# Patient Record
Sex: Male | Born: 1995 | Race: White | Hispanic: No | Marital: Single | Smoking: Former smoker
Health system: Southern US, Community
[De-identification: ages and names within clinical notes are randomized; demographics above are authoritative.]

## PROBLEM LIST (undated history)

## (undated) DIAGNOSIS — Z8489 Family history of other specified conditions: Secondary | ICD-10-CM

## (undated) DIAGNOSIS — F410 Panic disorder [episodic paroxysmal anxiety] without agoraphobia: Secondary | ICD-10-CM

## (undated) DIAGNOSIS — M25362 Other instability, left knee: Secondary | ICD-10-CM

## (undated) HISTORY — PX: DENTAL SURGERY: SHX609

## (undated) HISTORY — PX: WISDOM TOOTH EXTRACTION: SHX21

## (undated) HISTORY — PX: KNEE ARTHROSCOPY: SHX127

---
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
## 2017-11-17 ENCOUNTER — Other Ambulatory Visit: Payer: Self-pay

## 2017-11-17 ENCOUNTER — Encounter: Payer: Self-pay | Admitting: Emergency Medicine

## 2017-11-17 ENCOUNTER — Ambulatory Visit
Admission: EM | Admit: 2017-11-17 | Discharge: 2017-11-17 | Disposition: A | Discharge status: Home / Self Care | Payer: Medicaid Other | Attending: Emergency Medicine | Admitting: Emergency Medicine

## 2017-11-17 DIAGNOSIS — F410 Panic disorder [episodic paroxysmal anxiety] without agoraphobia: Secondary | ICD-10-CM

## 2017-11-17 DIAGNOSIS — F411 Generalized anxiety disorder: Secondary | ICD-10-CM

## 2017-11-17 HISTORY — DX: Panic disorder (episodic paroxysmal anxiety): F41.0

## 2017-11-17 MED ORDER — DIAZEPAM 2 MG PO TABS: 2.0000 mg | ORAL_TABLET | Freq: Three times a day (TID) | ORAL | 0 refills | Status: DC | PRN

## 2017-11-17 MED ORDER — HYDROXYZINE HCL 50 MG PO TABS: 50.0000 mg | ORAL_TABLET | Freq: Four times a day (QID) | ORAL | 1 refills | Status: DC | PRN

## 2017-11-17 MED ORDER — PAROXETINE HCL 10 MG PO TABS: 10.0000 mg | ORAL_TABLET | Freq: Every day | ORAL | 0 refills | Status: DC

## 2017-11-17 NOTE — ED Provider Notes (Addendum)
HPI  SUBJECTIVE:  Zachary Ballard is a 22 y.o. male who presents with 1-2 panic attacks per day described as shortness of breath for the past 2 weeks.  These last up to several hours.  He states that his anxiety has "been through the roof" over the past 2 months.  States that the panic attacks are becoming more frequent, although they do not happen daily.  He reports that his mind is racing, that he has difficulty getting a sleep at night.  States that he has vivid nightmares and is only getting 3 hours of sleep at night due to the nightmares.  He states that he has a long history of panic attacks/anxiety and states that it was fairly under control for years 2 months ago.  States that his current panic attacks are identical to previous ones.  He is not sure why it came back.  He states that he was on a as needed medication for his panic attacks, does not remember what it was.  States that it acted quickly.  He was not on any long-term or preventative medications.  He has tried melatonin, Benadryl, Dramamine without improvement in his symptoms.  No aggravating factors.  No suicidal, homicidal ideation, auditory or visual hallucinations, denies depression.  Past medical history of panic attacks, no history of depression, psychiatric admissions, suicide attempts.  PMD: None.    Past Medical History:  Diagnosis Date  . Family history of adverse reaction to anesthesia    mom itches/ nausea/hard to get to sleep  . Panic attacks   . Patellar instability of left knee    s/p multiple surgeries by Dr. Ivin Poot at [facility]    Past Surgical History:  Procedure Laterality Date  . DENTAL SURGERY    . KNEE ARTHROSCOPY Left    medial patellofemoral ligament repair  . KNEE SURGERY  2018   Dr. Ivin Poot at [facility], hx of multiple surgeries  . WISDOM TOOTH EXTRACTION      Family History  Problem Relation Age of Onset  . Epilepsy Mother   . Other Father        unknown medical history    Social History   Tobacco Use   . Smoking status: Former Smoker    Last attempt to quit: 11/17/2016    Years since quitting: 1.0  . Smokeless tobacco: Former Neurosurgeon    Types: Chew  Substance Use Topics  . Alcohol use: Not Currently  . Drug use: Not Currently    Types: Marijuana    Comment: used in high school once    No current facility-administered medications for this encounter.   Current Outpatient Medications:  .  hydrOXYzine (ATARAX/VISTARIL) 50 MG tablet, Take 1 tablet (50 mg total) by mouth every 6 (six) hours as needed for anxiety. May take 2 tabs at night, Disp: 60 tablet, Rfl: 1  No Known Allergies   ROS  As noted in HPI.   Physical Exam  BP 115/74 (BP Location: Left Arm)   Pulse 64   Temp 98.3 F (36.8 C) (Oral)   Resp 16   Ht 5\' 9"  (1.753 m)   Wt 68 kg   SpO2 100%   BMI 22.15 kg/m   Constitutional: Well developed, well nourished, no acute distress.  Well-groomed Eyes:  EOMI, conjunctiva normal bilaterally HENT: Normocephalic, atraumatic,mucus membranes moist Respiratory: Normal inspiratory effort lungs clear bilaterally Cardiovascular: Normal rate, regular rhythm, no murmurs, rubs, gallops Endocrine: Normal nontender thyroid. GI: nondistended skin: No rash, skin intact Musculoskeletal:  no deformities Neurologic: Alert & oriented x 3, no focal neuro deficits Psychiatric: Speech and behavior appropriate.  Normal affect, no apparent auditory or visual hallucinations.  No suicidal or homicidal ideation.    ED Course   Medications - No data to display  No orders of the defined types were placed in this encounter.   No results found for this or any previous visit (from the past 24 hour(s)). No results found.  ED Clinical Impression  Panic attacks  Anxiety state   ED Assessment/Plan  Care everywhere where records reviewed.  No note of anxiety.   Patient has a long-standing history of anxiety, which was well controlled up until several months ago.  He does not recall being  on any long-term medications for this.  He does not appear to be having a psychiatric emergency at this time.  We will try Vistaril 50 mg every 6 hours, 100 mg at night.  Provided information about the [facility] mental health resources for counseling in [place] and also gave patient Dr. Raelyn Mora, psychiatrist at [facility] information.  We will also provide primary care referral for ongoing care.  Initially wrote a prescription of hydroxyzine, patient states that he talked to his mother, and states that the hydroxyzine "did not work well for him".  States that his mother told him that he also tried Zoloft without improvement in his symptoms.  States that he was on low-dose diazepam in the past that worked well for him.  Patient does not want the hydroxyzine as he is afraid that it makes his panic attacks worse.  We will try some low-dose Paxil, 10 mg, and short course of diazepam to take as needed.  Discussed with him the possibility of addiction with the diazepam and instructed him to take it only as needed.  [facility] narcotic database reviewed.  Patient had a prescription of 20 oxycodone written in February.  No history of benzodiazepine prescriptions.  he denies substance, alcohol abuse history.  Discussed  MDM, treatment plan, and plan for follow-up with patient. Discussed sn/sx that should prompt return to the ED. patient agrees with plan.   Meds ordered this encounter  Medications  . hydrOXYzine (ATARAX/VISTARIL) 50 MG tablet    Sig: Take 1 tablet (50 mg total) by mouth every 6 (six) hours as needed for anxiety. May take 2 tabs at night    Dispense:  60 tablet    Refill:  1    *This clinic note was created using Scientist, clinical (histocompatibility and immunogenetics). Therefore, there may be occasional mistakes despite careful proofreading.   ?   Domenick Gong, MD 11/17/17 1246    Domenick Gong, MD 11/17/17 1308

## 2017-11-17 NOTE — Discharge Instructions (Addendum)
Try the Paxil.  You may need to have it increased in a couple of weeks.  A primary care physician or Dr. Lucianne Muss will be able to help you with that.  Use the diazepam only for severe panic attacks as we discussed.  [facility] has a walk-in clinic Monday through Fridays, 8-5 PM.  They may be good to talk to.  Follow-up with a primary care physician of your choice, see list below.  Here is a list of primary care providers who are taking new patients:  Dr. Elizabeth Sauer, Dr. Schuyler Amor [address] [place] [address] [phone]  [facility] [address] [place] [address]  [phone]  [facility] [address] [place], [place] [address] [phone]  [facility] [address] [place]  [phone] [place], [place] [address]  Here are clinics/ other resources who will see you if you do not have insurance. Some have certain criteria that you must meet. Call them and find out what they are:  [facility]: [address], [place], [place] [address] Phone: [phone] Hours: First and Third Saturdays of each Month, 9 a.m. - 1 p.m.  [facility]: [address] [place], [place] [address] Phone: [phone] Hours: Tuesday, 4 p.m. - 8 p.m. Thursday, 1 p.m. - 8 p.m. Wednesday, 9 a.m. - [facility] [address] [place], [place] [address] Phone: [phone] Pharmacy Phone Number: [phone] Dental Phone Number: [phone] [facility] Insurance Help: [phone]  Dental Hours: Monday - Thursday, 8 a.m. - 6 p.m.  [facility] [address], [place], [place] [address] Phone: [phone] Pharmacy Phone Number: [phone] [facility] Insurance Help: [phone]  [facility] [address] [place], [place] [address] Phone: [phone] Pharmacy Phone Number: [phone] [facility] Insurance Help: [phone]  [facility] [address] [place], [place] [address] Phone: [phone] [facility] Insurance Help: [phone]   [facility] [address] [place], [place] [address] Phone: [phone]  Go to www.goodrx.com to look up your medications. This will give you a list of where you can find your prescriptions at the most affordable prices. Or ask the pharmacist what the cash price is, or if they have any other discount programs available to help make your medication more affordable. This can be less expensive than what you would pay with insurance.

## 2017-11-17 NOTE — ED Triage Notes (Signed)
Patient in today c/o panic attacks. Patient states he has a history of panic attacks. Patient states he was doing good, until 2 months ago and the panic attacks started again.

## 2018-01-22 ENCOUNTER — Telehealth: Payer: Self-pay | Admitting: Emergency Medicine

## 2018-01-22 NOTE — Telephone Encounter (Signed)
Patient called in today requesting a refill on Diazepam prescribed by Dr. Chaney MallingMortenson on 11/17/17. Advised patient that we do not refill medication. Patient voiced understanding.

## 2018-03-09 ENCOUNTER — Other Ambulatory Visit: Payer: Self-pay

## 2018-03-09 ENCOUNTER — Emergency Department: Payer: Medicaid Other

## 2018-03-09 ENCOUNTER — Encounter: Payer: Self-pay | Admitting: Emergency Medicine

## 2018-03-09 ENCOUNTER — Emergency Department
Admission: EM | Admit: 2018-03-09 | Discharge: 2018-03-09 | Disposition: A | Discharge status: Home / Self Care | Payer: Medicaid Other | Attending: Student in an Organized Health Care Education/Training Program | Admitting: Student in an Organized Health Care Education/Training Program

## 2018-03-09 DIAGNOSIS — Z79899 Other long term (current) drug therapy: Secondary | ICD-10-CM | POA: Insufficient documentation

## 2018-03-09 DIAGNOSIS — N201 Calculus of ureter: Secondary | ICD-10-CM

## 2018-03-09 DIAGNOSIS — Z87891 Personal history of nicotine dependence: Secondary | ICD-10-CM | POA: Insufficient documentation

## 2018-03-09 DIAGNOSIS — R109 Unspecified abdominal pain: Secondary | ICD-10-CM

## 2018-03-09 DIAGNOSIS — N489 Disorder of penis, unspecified: Secondary | ICD-10-CM | POA: Diagnosis not present

## 2018-03-09 LAB — COMPREHENSIVE METABOLIC PANEL
ALBUMIN: 4 g/dL (ref 3.5–5.0)
ALT: 22 U/L (ref 0–44)
AST: 23 U/L (ref 15–41)
Alkaline Phosphatase: 76 U/L (ref 38–126)
Anion gap: 11 (ref 5–15)
BUN: 13 mg/dL (ref 6–20)
CHLORIDE: 102 mmol/L (ref 98–111)
CO2: 25 mmol/L (ref 22–32)
CREATININE: 1.13 mg/dL (ref 0.61–1.24)
Calcium: 8.8 mg/dL — ABNORMAL LOW (ref 8.9–10.3)
GFR calc non Af Amer: >60 mL/min (ref >60)
GLUCOSE: 135 mg/dL — AB (ref 70–99)
Potassium: 3.5 mmol/L (ref 3.5–5.1)
SODIUM: 138 mmol/L (ref 135–145)
Total Bilirubin: 0.8 mg/dL (ref 0.3–1.2)
Total Protein: 7.1 g/dL (ref 6.5–8.1)

## 2018-03-09 LAB — CBC
HEMATOCRIT: 41.9 % (ref 39.0–52.0)
Hemoglobin: 14.3 g/dL (ref 13.0–17.0)
MCH: 30.6 pg (ref 26.0–34.0)
MCHC: 34.1 g/dL (ref 30.0–36.0)
MCV: 89.5 fL (ref 80.0–100.0)
NRBC: 0 % (ref 0.0–0.2)
Platelets: 327 10*3/uL (ref 150–400)
RBC: 4.68 MIL/uL (ref 4.22–5.81)
RDW: 11.7 % (ref 11.5–15.5)
WBC: 7.1 10*3/uL (ref 4.0–10.5)

## 2018-03-09 LAB — URINALYSIS, COMPLETE (UACMP) WITH MICROSCOPIC
BILIRUBIN URINE: NEGATIVE
Bacteria, UA: NONE SEEN
Glucose, UA: NEGATIVE mg/dL
KETONES UR: NEGATIVE mg/dL
Leukocytes, UA: NEGATIVE
Nitrite: NEGATIVE
Protein, ur: NEGATIVE mg/dL
RBC / HPF: >50 RBC/hpf — ABNORMAL HIGH (ref 0–5)
SQUAMOUS EPITHELIAL / LPF: NONE SEEN (ref 0–5)
Specific Gravity, Urine: 1.017 (ref 1.005–1.030)
pH: 5 (ref 5.0–8.0)

## 2018-03-09 LAB — LIPASE, BLOOD: LIPASE: 27 U/L (ref 11–51)

## 2018-03-09 MED ORDER — MORPHINE SULFATE (PF) 4 MG/ML IV SOLN
4.0000 mg | INTRAVENOUS | Status: DC | PRN
  Administered 2018-03-09: 4 mg via INTRAVENOUS
  Filled 2018-03-09: qty 1

## 2018-03-09 MED ORDER — NAPROXEN 500 MG PO TABS: 500.0000 mg | ORAL_TABLET | Freq: Two times a day (BID) | ORAL | 0 refills | Status: AC

## 2018-03-09 MED ORDER — KETOROLAC TROMETHAMINE 30 MG/ML IJ SOLN
15.0000 mg | Freq: Once | INTRAMUSCULAR | Status: DC
  Filled 2018-03-09: qty 1

## 2018-03-09 MED ORDER — HYDROCODONE-ACETAMINOPHEN 5-325 MG PO TABS: 1.0000 | ORAL_TABLET | ORAL | 0 refills | Status: DC | PRN

## 2018-03-09 MED ORDER — ONDANSETRON HCL 4 MG PO TABS: 4.0000 mg | ORAL_TABLET | Freq: Every day | ORAL | 0 refills | Status: AC | PRN

## 2018-03-09 MED ORDER — PROMETHAZINE HCL 25 MG/ML IJ SOLN: 12.5000 mg | Freq: Four times a day (QID) | INTRAMUSCULAR | Status: DC | PRN

## 2018-03-09 MED ORDER — KETOROLAC TROMETHAMINE 30 MG/ML IJ SOLN
15.0000 mg | Freq: Once | INTRAMUSCULAR | Status: AC
  Administered 2018-03-09: 15 mg via INTRAVENOUS
  Filled 2018-03-09: qty 1

## 2018-03-09 NOTE — ED Provider Notes (Signed)
[facility] Emergency Department Provider Note    First MD Initiated Contact with Patient 03/09/18 [phone]     (approximate)  I have reviewed the triage vital signs and the nursing notes.   HISTORY  Chief Complaint Abdominal Pain and Back Pain    HPI Zachary Ballard is a 22 y.o. male below listed past medical history presents the ER with less than 1 day of sudden onset right flank pain and feeling as if something is stuck in the "tube of his penis ".  States it will be evaluating triage and providing urine sample he feels that he did pass a stone.  But is still having right flank pain.  Not quite as severe as previously but still there.  Denies any fevers.  No nausea or vomiting.  No diarrhea.   Past Medical History:  Diagnosis Date  . Family history of adverse reaction to anesthesia    mom itches/ nausea/hard to get to sleep  . Panic attacks   . Patellar instability of left knee    s/p multiple surgeries by Dr. Ivin Poot at [facility] History  Problem Relation Age of Onset  . Epilepsy Mother   . Other Father        unknown medical history   Past Surgical History:  Procedure Laterality Date  . DENTAL SURGERY    . KNEE ARTHROSCOPY Left    medial patellofemoral ligament repair  . KNEE SURGERY  2018   Dr. Ivin Poot at [facility], hx of multiple surgeries  . WISDOM TOOTH EXTRACTION     There are no active problems to display for this patient.     Prior to Admission medications   Medication Sig Start Date End Date Taking? Authorizing Provider  diazepam (VALIUM) 2 MG tablet Take 1 tablet (2 mg total) by mouth every 8 (eight) hours as needed for anxiety. 11/17/17   Domenick Gong, MD  HYDROcodone-acetaminophen (NORCO) 5-325 MG tablet Take 1 tablet by mouth every 4 (four) hours as needed for moderate pain. 03/09/18   Willy Eddy, MD  naproxen (NAPROSYN) 500 MG tablet Take 1 tablet (500 mg total) by mouth 2 (two) times daily with a meal. 03/09/18 03/09/19   Willy Eddy, MD  ondansetron (ZOFRAN) 4 MG tablet Take 1 tablet (4 mg total) by mouth daily as needed for nausea or vomiting. 03/09/18 03/09/19  Willy Eddy, MD  PARoxetine (PAXIL) 10 MG tablet Take 1 tablet (10 mg total) by mouth daily. 11/17/17   Domenick Gong, MD    Allergies Patient has no known allergies.    Social History Social History   Tobacco Use  . Smoking status: Former Smoker    Last attempt to quit: 11/17/2016    Years since quitting: 1.3  . Smokeless tobacco: Former Neurosurgeon    Types: Chew  Substance Use Topics  . Alcohol use: Not Currently  . Drug use: Not Currently    Types: Marijuana    Comment: used in high school once    Review of Systems Patient denies headaches, rhinorrhea, blurry vision, numbness, shortness of breath, chest pain, edema, cough, abdominal pain, nausea, vomiting, diarrhea, dysuria, fevers, rashes or hallucinations unless otherwise stated above in HPI. ____________________________________________   PHYSICAL EXAM:  VITAL SIGNS: Vitals:   03/09/18 0930 03/09/18 1020  BP: 121/76 127/69  Pulse: 95 (!) 101  Resp:  18  SpO2: 98% 98%    Constitutional: Alert and oriented.  Eyes: Conjunctivae are normal.  Head: Atraumatic. Nose: No congestion/rhinnorhea.  Mouth/Throat: Mucous membranes are moist.   Neck: No stridor. Painless ROM.  Cardiovascular: Normal rate, regular rhythm. Grossly normal heart sounds.  Good peripheral circulation. Respiratory: Normal respiratory effort.  No retractions. Lungs CTAB. Gastrointestinal: Soft and nontender. No distention. No abdominal bruits. + right CVA tenderness. Genitourinary:  Musculoskeletal: No lower extremity tenderness nor edema.  No joint effusions. Neurologic:  Normal speech and language. No gross focal neurologic deficits are appreciated. No facial droop Skin:  Skin is warm, dry and intact. No rash noted. Psychiatric: Mood and affect are normal. Speech and behavior are  normal.  ____________________________________________   LABS (all labs ordered are listed, but only abnormal results are displayed)  Results for orders placed or performed during the hospital encounter of 03/09/18 (from the past 24 hour(s))  Lipase, blood     Status: None   Collection Time: 03/09/18  6:27 AM  Result Value Ref Range   Lipase 27 11 - 51 U/L  Comprehensive metabolic panel     Status: Abnormal   Collection Time: 03/09/18  6:27 AM  Result Value Ref Range   Sodium 138 135 - 145 mmol/L   Potassium 3.5 3.5 - 5.1 mmol/L   Chloride 102 98 - 111 mmol/L   CO2 25 22 - 32 mmol/L   Glucose, Bld 135 (H) 70 - 99 mg/dL   BUN 13 6 - 20 mg/dL   Creatinine, Ser 1.611.13 0.61 - 1.24 mg/dL   Calcium 8.8 (L) 8.9 - 10.3 mg/dL   Total Protein 7.1 6.5 - 8.1 g/dL   Albumin 4.0 3.5 - 5.0 g/dL   AST 23 15 - 41 U/L   ALT 22 0 - 44 U/L   Alkaline Phosphatase 76 38 - 126 U/L   Total Bilirubin 0.8 0.3 - 1.2 mg/dL   GFR calc non Af Amer >60 >60 mL/min   GFR calc Af Amer >60 >60 mL/min   Anion gap 11 5 - 15  CBC     Status: None   Collection Time: 03/09/18  6:27 AM  Result Value Ref Range   WBC 7.1 4.0 - 10.5 K/uL   RBC 4.68 4.22 - 5.81 MIL/uL   Hemoglobin 14.3 13.0 - 17.0 g/dL   HCT 09.641.9 04.539.0 - 40.952.0 %   MCV 89.5 80.0 - 100.0 fL   MCH 30.6 26.0 - 34.0 pg   MCHC 34.1 30.0 - 36.0 g/dL   RDW 81.111.7 91.411.5 - 78.215.5 %   Platelets 327 150 - 400 K/uL   nRBC 0.0 0.0 - 0.2 %  Urinalysis, Complete w Microscopic     Status: Abnormal   Collection Time: 03/09/18  6:27 AM  Result Value Ref Range   Color, Urine YELLOW (A) YELLOW   APPearance CLEAR (A) CLEAR   Specific Gravity, Urine 1.017 1.005 - 1.030   pH 5.0 5.0 - 8.0   Glucose, UA NEGATIVE NEGATIVE mg/dL   Hgb urine dipstick LARGE (A) NEGATIVE   Bilirubin Urine NEGATIVE NEGATIVE   Ketones, ur NEGATIVE NEGATIVE mg/dL   Protein, ur NEGATIVE NEGATIVE mg/dL   Nitrite NEGATIVE NEGATIVE   Leukocytes, UA NEGATIVE NEGATIVE   RBC / HPF >50 (H) 0 - 5  RBC/hpf   WBC, UA 0-5 0 - 5 WBC/hpf   Bacteria, UA NONE SEEN NONE SEEN   Squamous Epithelial / LPF NONE SEEN 0 - 5   Mucus PRESENT    Ca Oxalate Crys, UA PRESENT    ____________________________________________  ____________________________________________  RADIOLOGY  I personally reviewed all radiographic images  ordered to evaluate for the above acute complaints and reviewed radiology reports and findings.  These findings were personally discussed with the patient.  Please see medical record for radiology report.  ____________________________________________   PROCEDURES  Procedure(s) performed:  Procedures    Critical Care performed: no ____________________________________________   INITIAL IMPRESSION / ASSESSMENT AND PLAN / ED COURSE  Pertinent labs & imaging results that were available during my care of the patient were reviewed by me and considered in my medical decision making (see chart for details).   DDX: stone, uti, urethritis, doubt appy  Lelan PonsJacob C Gerhold is a 22 y.o. who presents to the ED with symptoms as described above.  Patient nontoxic-appearing.  Does have right flank pain.  Does have hematuria.  Ultrasound and x-ray show evidence of right-sided ureterolithiasis without hydronephrosis.  Patient's pain is controlled.  Not consistent with acute intra-abdominal process.  Do not feel that CT imaging clinically indicated.  Patient was able to tolerate PO and was able to ambulate with a steady gait.  Have discussed with the patient and available family all diagnostics and treatments performed thus far and all questions were answered to the best of my ability. The patient demonstrates understanding and agreement with plan.       As part of my medical decision making, I reviewed the following data within the electronic MEDICAL RECORD NUMBER Nursing notes reviewed and incorporated, Labs reviewed, notes from prior ED  visits   ____________________________________________   FINAL CLINICAL IMPRESSION(S) / ED DIAGNOSES  Final diagnoses:  Flank pain  Ureterolithiasis      NEW MEDICATIONS STARTED DURING THIS VISIT:  New Prescriptions   HYDROCODONE-ACETAMINOPHEN (NORCO) 5-325 MG TABLET    Take 1 tablet by mouth every 4 (four) hours as needed for moderate pain.   NAPROXEN (NAPROSYN) 500 MG TABLET    Take 1 tablet (500 mg total) by mouth 2 (two) times daily with a meal.   ONDANSETRON (ZOFRAN) 4 MG TABLET    Take 1 tablet (4 mg total) by mouth daily as needed for nausea or vomiting.     Note:  This document was prepared using Dragon voice recognition software and may include unintentional dictation errors.    Willy Eddyobinson, Eydie Wormley, MD 03/09/18 1027

## 2018-03-09 NOTE — ED Triage Notes (Signed)
Pt to triage with co pain to his penis that started around 7 pm. States felt like something was stuck in his penis. Pt reports later developed pain into his pelvic region and then lower abd region midline. Pt also reports pain to her lower back .

## 2018-03-09 NOTE — ED Notes (Signed)
First Nurse Note: Connye BurkittAlly RN aware of room placement.

## 2018-03-09 NOTE — ED Notes (Signed)
Patient transported to Ultrasound 

## 2018-03-09 NOTE — ED Notes (Signed)
First Nurse Note: Patient sitting in lobby, reassured about wait.  No new complaints verbalized.

## 2018-03-09 NOTE — ED Notes (Signed)
Pt alert and oriented X4, active, cooperative, pt in NAD. RR even and unlabored, color WNL.  Pt informed to return if any life threatening symptoms occur.  Discharge and followup instructions reviewed. Ambulates safely. All of belongings home with patient.

## 2018-03-09 NOTE — ED Notes (Signed)
Triage information charted to this time was actually entered by this RN in error under another user name.

## 2020-09-27 ENCOUNTER — Inpatient Hospital Stay: Payer: Medicaid Other

## 2020-09-27 ENCOUNTER — Other Ambulatory Visit: Payer: Self-pay

## 2020-09-27 ENCOUNTER — Emergency Department: Payer: Medicaid Other

## 2020-09-27 ENCOUNTER — Inpatient Hospital Stay (HOSPITAL_COMMUNITY)
Admit: 2020-09-27 | Discharge: 2020-09-27 | Disposition: A | Discharge status: Home / Self Care | Payer: Medicaid Other | Attending: Internal Medicine | Admitting: Internal Medicine

## 2020-09-27 ENCOUNTER — Inpatient Hospital Stay
Admission: EM | Admit: 2020-09-27 | Discharge: 2020-09-29 | DRG: 871 | Disposition: A | Discharge status: Home / Self Care | Payer: Medicaid Other | Attending: Obstetrics and Gynecology | Admitting: Obstetrics and Gynecology

## 2020-09-27 DIAGNOSIS — B348 Other viral infections of unspecified site: Secondary | ICD-10-CM | POA: Diagnosis present

## 2020-09-27 DIAGNOSIS — T50901A Poisoning by unspecified drugs, medicaments and biological substances, accidental (unintentional), initial encounter: Secondary | ICD-10-CM

## 2020-09-27 DIAGNOSIS — I248 Other forms of acute ischemic heart disease: Secondary | ICD-10-CM | POA: Diagnosis present

## 2020-09-27 DIAGNOSIS — Z79899 Other long term (current) drug therapy: Secondary | ICD-10-CM

## 2020-09-27 DIAGNOSIS — T405X1A Poisoning by cocaine, accidental (unintentional), initial encounter: Secondary | ICD-10-CM | POA: Diagnosis present

## 2020-09-27 DIAGNOSIS — J9601 Acute respiratory failure with hypoxia: Secondary | ICD-10-CM

## 2020-09-27 DIAGNOSIS — E872 Acidosis: Secondary | ICD-10-CM

## 2020-09-27 DIAGNOSIS — A419 Sepsis, unspecified organism: Secondary | ICD-10-CM | POA: Diagnosis present

## 2020-09-27 DIAGNOSIS — R0902 Hypoxemia: Secondary | ICD-10-CM

## 2020-09-27 DIAGNOSIS — F410 Panic disorder [episodic paroxysmal anxiety] without agoraphobia: Secondary | ICD-10-CM | POA: Diagnosis present

## 2020-09-27 DIAGNOSIS — J690 Pneumonitis due to inhalation of food and vomit: Secondary | ICD-10-CM | POA: Diagnosis present

## 2020-09-27 DIAGNOSIS — J189 Pneumonia, unspecified organism: Secondary | ICD-10-CM | POA: Diagnosis not present

## 2020-09-27 DIAGNOSIS — R778 Other specified abnormalities of plasma proteins: Secondary | ICD-10-CM | POA: Diagnosis present

## 2020-09-27 DIAGNOSIS — R6521 Severe sepsis with septic shock: Secondary | ICD-10-CM | POA: Diagnosis present

## 2020-09-27 DIAGNOSIS — F1910 Other psychoactive substance abuse, uncomplicated: Secondary | ICD-10-CM

## 2020-09-27 DIAGNOSIS — Z20822 Contact with and (suspected) exposure to covid-19: Secondary | ICD-10-CM | POA: Diagnosis present

## 2020-09-27 DIAGNOSIS — Z87891 Personal history of nicotine dependence: Secondary | ICD-10-CM

## 2020-09-27 LAB — CBC WITH DIFFERENTIAL/PLATELET
Abs Immature Granulocytes: 0.12 10*3/uL — ABNORMAL HIGH (ref 0.00–0.07)
Basophils Absolute: 0 10*3/uL (ref 0.0–0.1)
Basophils Relative: 0 %
Eosinophils Absolute: 0 10*3/uL (ref 0.0–0.5)
Eosinophils Relative: 0 %
HCT: 46.4 % (ref 39.0–52.0)
Hemoglobin: 16.5 g/dL (ref 13.0–17.0)
Immature Granulocytes: 1 %
Lymphocytes Relative: 8 %
Lymphs Abs: 1.1 10*3/uL (ref 0.7–4.0)
MCH: 31.3 pg (ref 26.0–34.0)
MCHC: 35.6 g/dL (ref 30.0–36.0)
MCV: 87.9 fL (ref 80.0–100.0)
Monocytes Absolute: 0.7 10*3/uL (ref 0.1–1.0)
Monocytes Relative: 5 %
Neutro Abs: 11.4 10*3/uL — ABNORMAL HIGH (ref 1.7–7.7)
Neutrophils Relative %: 86 %
Platelets: 335 10*3/uL (ref 150–400)
RBC: 5.28 MIL/uL (ref 4.22–5.81)
RDW: 11.7 % (ref 11.5–15.5)
WBC: 13.4 10*3/uL — ABNORMAL HIGH (ref 4.0–10.5)
nRBC: 0 % (ref 0.0–0.2)

## 2020-09-27 LAB — LACTIC ACID, PLASMA
Lactic Acid, Venous: 4.4 mmol/L (ref 0.5–1.9)
Lactic Acid, Venous: 3 mmol/L (ref 0.5–1.9)
Lactic Acid, Venous: 2.7 mmol/L (ref 0.5–1.9)

## 2020-09-27 LAB — ECHOCARDIOGRAM COMPLETE BUBBLE STUDY
AR max vel: 2.75 cm2
AV Area VTI: 2.89 cm2
AV Area mean vel: 2.63 cm2
AV Mean grad: 2 mmHg
AV Peak grad: 3.3 mmHg
Ao pk vel: 0.9 m/s
Area-P 1/2: 3.99 cm2
S' Lateral: 3.31 cm

## 2020-09-27 LAB — URINE DRUG SCREEN, QUALITATIVE (ARMC ONLY)
Amphetamines, Ur Screen: NOT DETECTED
Barbiturates, Ur Screen: NOT DETECTED
Benzodiazepine, Ur Scrn: NOT DETECTED
Cannabinoid 50 Ng, Ur ~~LOC~~: POSITIVE — AB
Cocaine Metabolite,Ur ~~LOC~~: NOT DETECTED
MDMA (Ecstasy)Ur Screen: NOT DETECTED
Methadone Scn, Ur: NOT DETECTED
Opiate, Ur Screen: NOT DETECTED
Phencyclidine (PCP) Ur S: NOT DETECTED
Tricyclic, Ur Screen: NOT DETECTED

## 2020-09-27 LAB — BLOOD GAS, ARTERIAL
Acid-Base Excess: 0.6 mmol/L (ref 0.0–2.0)
Bicarbonate: 23.1 mmol/L (ref 20.0–28.0)
FIO2: 100
O2 Saturation: 81.6 %
Patient temperature: 37
pCO2 arterial: 31 mmHg — ABNORMAL LOW (ref 32.0–48.0)
pH, Arterial: 7.48 — ABNORMAL HIGH (ref 7.350–7.450)
pO2, Arterial: 42 mmHg — ABNORMAL LOW (ref 83.0–108.0)

## 2020-09-27 LAB — URINALYSIS, COMPLETE (UACMP) WITH MICROSCOPIC
Bacteria, UA: NONE SEEN
Bilirubin Urine: NEGATIVE
Glucose, UA: >=500 mg/dL — AB
Hgb urine dipstick: NEGATIVE
Ketones, ur: NEGATIVE mg/dL
Leukocytes,Ua: NEGATIVE
Nitrite: NEGATIVE
Protein, ur: NEGATIVE mg/dL
Specific Gravity, Urine: 1.016 (ref 1.005–1.030)
Squamous Epithelial / HPF: NONE SEEN (ref 0–5)
pH: 5 (ref 5.0–8.0)

## 2020-09-27 LAB — APTT: aPTT: 25 seconds (ref 24–36)

## 2020-09-27 LAB — RESPIRATORY PANEL BY PCR

## 2020-09-27 LAB — HEPATITIS PANEL, ACUTE
HCV Ab: NONREACTIVE
Hep A IgM: NONREACTIVE
Hep B C IgM: NONREACTIVE
Hepatitis B Surface Ag: NONREACTIVE

## 2020-09-27 LAB — SALICYLATE LEVEL: Salicylate Lvl: <7 mg/dL — ABNORMAL LOW (ref 7.0–30.0)

## 2020-09-27 LAB — FERRITIN: Ferritin: 126 ng/mL (ref 24–336)

## 2020-09-27 LAB — EXPECTORATED SPUTUM ASSESSMENT W GRAM STAIN, RFLX TO RESP C

## 2020-09-27 LAB — HIV ANTIBODY (ROUTINE TESTING W REFLEX): HIV Screen 4th Generation wRfx: NONREACTIVE

## 2020-09-27 LAB — COMPREHENSIVE METABOLIC PANEL
ALT: 63 U/L — ABNORMAL HIGH (ref 0–44)
AST: 82 U/L — ABNORMAL HIGH (ref 15–41)
Albumin: 3.9 g/dL (ref 3.5–5.0)
Alkaline Phosphatase: 125 U/L (ref 38–126)
Anion gap: 15 (ref 5–15)
BUN: 12 mg/dL (ref 6–20)
CO2: 25 mmol/L (ref 22–32)
Calcium: 9 mg/dL (ref 8.9–10.3)
Chloride: 96 mmol/L — ABNORMAL LOW (ref 98–111)
Creatinine, Ser: 1.12 mg/dL (ref 0.61–1.24)
GFR, Estimated: >60 mL/min (ref >60)
Glucose, Bld: 143 mg/dL — ABNORMAL HIGH (ref 70–99)
Potassium: 3.8 mmol/L (ref 3.5–5.1)
Sodium: 136 mmol/L (ref 135–145)
Total Bilirubin: 0.8 mg/dL (ref 0.3–1.2)
Total Protein: 7.3 g/dL (ref 6.5–8.1)

## 2020-09-27 LAB — PROCALCITONIN
Procalcitonin: 2.96 ng/mL
Procalcitonin: 7.74 ng/mL

## 2020-09-27 LAB — ETHANOL: Alcohol, Ethyl (B): <10 mg/dL (ref <10)

## 2020-09-27 LAB — IRON AND TIBC
Iron: 26 ug/dL — ABNORMAL LOW (ref 45–182)
Saturation Ratios: 9 % — ABNORMAL LOW (ref 17.9–39.5)
TIBC: 293 ug/dL (ref 250–450)
UIBC: 267 ug/dL

## 2020-09-27 LAB — GLUCOSE, CAPILLARY: Glucose-Capillary: 128 mg/dL — ABNORMAL HIGH (ref 70–99)

## 2020-09-27 LAB — PROTIME-INR
INR: 1.1 (ref 0.8–1.2)
Prothrombin Time: 14.2 seconds (ref 11.4–15.2)

## 2020-09-27 LAB — BRAIN NATRIURETIC PEPTIDE: B Natriuretic Peptide: 20.7 pg/mL (ref 0.0–100.0)

## 2020-09-27 LAB — TROPONIN I (HIGH SENSITIVITY)
Troponin I (High Sensitivity): 352 ng/L (ref <18)
Troponin I (High Sensitivity): 524 ng/L (ref <18)

## 2020-09-27 LAB — MRSA NEXT GEN BY PCR, NASAL: MRSA by PCR Next Gen: NOT DETECTED

## 2020-09-27 LAB — RESP PANEL BY RT-PCR (FLU A&B, COVID) ARPGX2
Influenza A by PCR: NEGATIVE
Influenza B by PCR: NEGATIVE
SARS Coronavirus 2 by RT PCR: NEGATIVE

## 2020-09-27 LAB — TYPE AND SCREEN
ABO/RH(D): O POS
Antibody Screen: NEGATIVE

## 2020-09-27 LAB — CORTISOL: Cortisol, Plasma: 44.9 ug/dL

## 2020-09-27 LAB — ACETAMINOPHEN LEVEL: Acetaminophen (Tylenol), Serum: <10 ug/mL — ABNORMAL LOW (ref 10–30)

## 2020-09-27 MED ORDER — LORAZEPAM 2 MG/ML IJ SOLN
1.0000 mg | Freq: Once | INTRAMUSCULAR | Status: AC
  Administered 2020-09-27: 1 mg via INTRAVENOUS
  Filled 2020-09-27: qty 1

## 2020-09-27 MED ORDER — SODIUM CHLORIDE 0.9 % IV BOLUS
1000.0000 mL | Freq: Once | INTRAVENOUS | Status: AC
  Administered 2020-09-27: 1000 mL via INTRAVENOUS

## 2020-09-27 MED ORDER — SODIUM CHLORIDE 0.9 % IV SOLN
3.0000 g | Freq: Once | INTRAVENOUS | Status: AC
  Administered 2020-09-27: 3 g via INTRAVENOUS
  Filled 2020-09-27: qty 8

## 2020-09-27 MED ORDER — HYDROXYZINE HCL 50 MG PO TABS
100.0000 mg | ORAL_TABLET | Freq: Three times a day (TID) | ORAL | Status: DC | PRN
  Administered 2020-09-28 (×2): 100 mg via ORAL
  Filled 2020-09-27 (×4): qty 2

## 2020-09-27 MED ORDER — SODIUM CHLORIDE 0.9 % IV SOLN
2.0000 g | INTRAVENOUS | Status: DC
  Administered 2020-09-27 – 2020-09-28 (×2): 2 g via INTRAVENOUS
  Filled 2020-09-27 (×2): qty 2
  Filled 2020-09-27: qty 20

## 2020-09-27 MED ORDER — POLYETHYLENE GLYCOL 3350 17 G PO PACK: 17.0000 g | PACK | Freq: Every day | ORAL | Status: DC | PRN

## 2020-09-27 MED ORDER — SODIUM CHLORIDE 0.9 % IV SOLN
3.0000 g | Freq: Four times a day (QID) | INTRAVENOUS | Status: DC
  Filled 2020-09-27 (×3): qty 8

## 2020-09-27 MED ORDER — HYDROCORTISONE NA SUCCINATE PF 100 MG IJ SOLR: 50.0000 mg | Freq: Four times a day (QID) | INTRAMUSCULAR | Status: DC

## 2020-09-27 MED ORDER — FAMOTIDINE IN NACL 20-0.9 MG/50ML-% IV SOLN
20.0000 mg | Freq: Two times a day (BID) | INTRAVENOUS | Status: DC
  Administered 2020-09-27 (×2): 20 mg via INTRAVENOUS
  Filled 2020-09-27 (×2): qty 50

## 2020-09-27 MED ORDER — BUPRENORPHINE HCL-NALOXONE HCL 8-2 MG SL FILM: 3.0000 | ORAL_FILM | Freq: Every day | SUBLINGUAL | Status: DC

## 2020-09-27 MED ORDER — CLONAZEPAM 0.5 MG PO TABS
0.5000 mg | ORAL_TABLET | Freq: Two times a day (BID) | ORAL | Status: DC | PRN
  Administered 2020-09-27: 0.5 mg via ORAL
  Filled 2020-09-27: qty 1

## 2020-09-27 MED ORDER — AZITHROMYCIN 500 MG IV SOLR
500.0000 mg | INTRAVENOUS | Status: DC
  Administered 2020-09-27 – 2020-09-28 (×2): 500 mg via INTRAVENOUS
  Filled 2020-09-27 (×3): qty 500

## 2020-09-27 MED ORDER — LACTATED RINGERS IV BOLUS (SEPSIS)
250.0000 mL | Freq: Once | INTRAVENOUS | Status: AC
  Administered 2020-09-27: 250 mL via INTRAVENOUS

## 2020-09-27 MED ORDER — CLONAZEPAM 1 MG PO TABS: 1.0000 mg | ORAL_TABLET | Freq: Three times a day (TID) | ORAL | Status: DC | PRN

## 2020-09-27 MED ORDER — DOCUSATE SODIUM 100 MG PO CAPS: 100.0000 mg | ORAL_CAPSULE | Freq: Two times a day (BID) | ORAL | Status: DC | PRN

## 2020-09-27 MED ORDER — LACTATED RINGERS IV BOLUS (SEPSIS)
1000.0000 mL | Freq: Once | INTRAVENOUS | Status: AC
  Administered 2020-09-27: 1000 mL via INTRAVENOUS

## 2020-09-27 MED ORDER — DIAZEPAM 5 MG/ML IJ SOLN
5.0000 mg | Freq: Four times a day (QID) | INTRAMUSCULAR | Status: DC | PRN
  Administered 2020-09-27 (×2): 5 mg via INTRAVENOUS
  Filled 2020-09-27 (×2): qty 2

## 2020-09-27 MED ORDER — CHLORHEXIDINE GLUCONATE 0.12 % MT SOLN
15.0000 mL | Freq: Two times a day (BID) | OROMUCOSAL | Status: DC
  Administered 2020-09-27: 15 mL via OROMUCOSAL
  Filled 2020-09-27: qty 15

## 2020-09-27 MED ORDER — ENOXAPARIN SODIUM 40 MG/0.4ML IJ SOSY
40.0000 mg | PREFILLED_SYRINGE | INTRAMUSCULAR | Status: DC
  Administered 2020-09-27 – 2020-09-28 (×2): 40 mg via SUBCUTANEOUS
  Filled 2020-09-27 (×2): qty 0.4

## 2020-09-27 MED ORDER — VANCOMYCIN HCL IN DEXTROSE 1-5 GM/200ML-% IV SOLN
1000.0000 mg | Freq: Once | INTRAVENOUS | Status: AC
  Administered 2020-09-27: 1000 mg via INTRAVENOUS
  Filled 2020-09-27: qty 200

## 2020-09-27 MED ORDER — IBUPROFEN 400 MG PO TABS
600.0000 mg | ORAL_TABLET | Freq: Four times a day (QID) | ORAL | Status: DC | PRN
  Administered 2020-09-27 – 2020-09-28 (×2): 600 mg via ORAL
  Filled 2020-09-27 (×2): qty 2

## 2020-09-27 MED ORDER — IOHEXOL 350 MG/ML SOLN
75.0000 mL | Freq: Once | INTRAVENOUS | Status: AC | PRN
  Administered 2020-09-27: 75 mL via INTRAVENOUS

## 2020-09-27 MED ORDER — BUPRENORPHINE HCL-NALOXONE HCL 8-2 MG SL SUBL: 3.0000 | SUBLINGUAL_TABLET | Freq: Every day | SUBLINGUAL | Status: DC

## 2020-09-27 MED ORDER — CHLORHEXIDINE GLUCONATE CLOTH 2 % EX PADS
6.0000 | MEDICATED_PAD | Freq: Every day | CUTANEOUS | Status: DC
  Administered 2020-09-27 – 2020-09-28 (×2): 6 via TOPICAL

## 2020-09-27 MED ORDER — ORAL CARE MOUTH RINSE: 15.0000 mL | Freq: Two times a day (BID) | OROMUCOSAL | Status: DC

## 2020-09-27 NOTE — ED Notes (Signed)
MD made aware of pt BP  

## 2020-09-27 NOTE — ED Notes (Signed)
Mom at bedside.

## 2020-09-27 NOTE — ED Triage Notes (Signed)
Pt presents to ER via ems from home.  Per emd, pt was found at home "breathing strange" and was given narcan.  Pt thought he was snorting cocaine tonight but thinks it might have been laced with fentanyl.  On arrival, pt was breathing 40-50x/min and very anxious.  Pt A&O x4 at this time and still breathing fast and appears dusky.  Pt placed on NRB d/t RA sats of 77%.

## 2020-09-27 NOTE — Progress Notes (Signed)
*  PRELIMINARY RESULTS* Echocardiogram 2D Echocardiogram has been performed.  Cristela Blue 09/27/2020, 2:39 PM

## 2020-09-27 NOTE — ED Notes (Signed)
Report to Irving Burton, ICU RN

## 2020-09-27 NOTE — Progress Notes (Signed)
Pt admitted to ICU on bipap. PRN anxiety medications administered. PRN ibuprofen for fever effective. To and from chest CT. PT felt like he was having a panic attack this evening on heated HFNC. Non-re breather placed on pt per pt request and per pt he feels better. Coughing up brown thick sputum, sent for culture. Reparatory panel pending and pt placed on droplet precautions. Will continue to monitor.

## 2020-09-27 NOTE — Progress Notes (Signed)
Pharmacy Antibiotic Note  Zachary Ballard is a 25 y.o. male admitted on 09/27/2020 with pneumonia.  Pharmacy has been consulted for Unasyn dosing.  Plan: Unasyn 3 gm IV x 1 given on 7/20 @ 0555. Unasyn 3 gm IV Q6H ordered to start on 7/20 @ 1200.   Weight: 70.3 kg (155 lb)  Temp (24hrs), Avg:99.5 F (37.5 C), Min:99.5 F (37.5 C), Max:99.5 F (37.5 C)  Recent Labs  Lab 09/27/20 0507 09/27/20 0538  WBC 13.4*  --   CREATININE 1.12  --   LATICACIDVEN  --  4.4*    CrCl cannot be calculated (Unknown ideal weight.).    No Known Allergies  Antimicrobials this admission:   >>    >>   Dose adjustments this admission:   Microbiology results:  BCx:   UCx:    Sputum:    MRSA PCR:   Thank you for allowing pharmacy to be a part of this patient's care.  Jay Haskew D 09/27/2020 6:07 AM

## 2020-09-27 NOTE — ED Notes (Signed)
Pt assisted to take sips of ginger ale. Pt asking about help sleeping but pt informed that Bipap works for those that are awake and able to maintain their airway. Pt verbalized understanding.

## 2020-09-27 NOTE — ED Notes (Signed)
Mother, Lissa Hoard made aware of pts status via phone call.  Can be reached at [phone] for any questions.

## 2020-09-27 NOTE — ED Notes (Signed)
Lab at bedside
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
## 2020-09-27 NOTE — ED Notes (Signed)
Pt given cup of water per OK from [place], NP.

## 2020-09-27 NOTE — ED Provider Notes (Signed)
[facility] Emergency Department Provider Note   ____________________________________________   Event Date/Time   First MD Initiated Contact with Patient 09/27/20 [phone]     (approximate)  I have reviewed the triage vital signs and the nursing notes.   HISTORY  Chief Complaint Overdose    HPI Zachary Ballard is a 25 y.o. male brought to the ED via EMS from home with a chief complaint of overdose.  Patient just got out of rehab for substance abuse.  Family found patient "breathing strange" and administered Narcan.  Patient states he thought he was snorting cocaine tonight but thinks it might have been laced with fentanyl.  Hyperventilating on arrival, room air saturation 77%.  Denies intentional overdose.  Denies SI/HI/AH/VH.  Complains of palpitations and difficulty breathing.  Denies fever, cough, abdominal pain, nausea, vomiting or dizziness.  No reports of vomiting at home.     Past Medical History:  Diagnosis Date   Family history of adverse reaction to anesthesia    mom itches/ nausea/hard to get to sleep   Panic attacks    Patellar instability of left knee    s/p multiple surgeries by Dr. Ivin Poot at [facility]    Patient Active Problem List   Diagnosis Date Noted   Accidental drug overdose 09/27/2020     Past Surgical History:  Procedure Laterality Date   DENTAL SURGERY     KNEE ARTHROSCOPY Left    medial patellofemoral ligament repair   KNEE SURGERY  2018   Dr. Ivin Poot at [facility], hx of multiple surgeries   WISDOM TOOTH EXTRACTION      Prior to Admission medications   Medication Sig Start Date End Date Taking? Authorizing Provider  hydrOXYzine (VISTARIL) 50 MG capsule Take 100 mg by mouth 3 (three) times daily as needed. 08/18/20  Yes [provider]  SUBOXONE 8-2 MG FILM Place 3 strips under the tongue daily. 08/29/20  Yes [provider]    Allergies Patient has no known allergies.  Family History  Problem Relation Age of  Onset   Epilepsy Mother    Other Father        unknown medical history    Social History Social History   Tobacco Use   Smoking status: Former   Smokeless tobacco: Former    Types: Associate Professor Use: Former  Substance Use Topics   Alcohol use: Not Currently   Drug use: Not Currently    Types: Marijuana    Comment: used in high school once    Review of Systems  Constitutional: No fever/chills Eyes: No visual changes. ENT: No sore throat. Cardiovascular: Positive for palpitations.  Denies chest pain. Respiratory: Positive for shortness of breath. Gastrointestinal: No abdominal pain.  No nausea, no vomiting.  No diarrhea.  No constipation. Genitourinary: Negative for dysuria. Musculoskeletal: Negative for back pain. Skin: Negative for rash. Neurological: Negative for headaches, focal weakness or numbness.   ____________________________________________   PHYSICAL EXAM:  VITAL SIGNS: ED Triage Vitals  Enc Vitals Group     BP      Pulse      Resp      Temp      Temp src      SpO2      Weight      Height      Head Circumference      Peak Flow      Pain Score      Pain Loc  Pain Edu?      Excl. in GC?     Constitutional: Alert and oriented.  Anxious appearing and in moderate acute distress. Eyes: Conjunctivae are normal. PERRL. EOMI. Head: Atraumatic. Nose: No congestion/rhinnorhea. Mouth/Throat: Mucous membranes are mildly dry. Neck: No stridor.   Cardiovascular: Tachycardic rate, regular rhythm. Grossly normal heart sounds.  Good peripheral circulation. Respiratory: Increased respiratory effort.  No retractions. Lungs with rhonchi and rales, right > left. Gastrointestinal: Soft and nontender to light or deep palpation. No distention. No abdominal bruits. No CVA tenderness. Musculoskeletal: No lower extremity tenderness nor edema.  No joint effusions. Neurologic:  Normal speech and language. No gross focal neurologic deficits are  appreciated.  Skin:  Skin is warm, dry and intact. No rash noted. Psychiatric: Mood and affect are anxious. Speech and behavior are normal.  ____________________________________________   LABS (all labs ordered are listed, but only abnormal results are displayed)  Labs Reviewed  CBC WITH DIFFERENTIAL/PLATELET - Abnormal; Notable for the following components:      Result Value   WBC 13.4 (*)    Neutro Abs 11.4 (*)    Abs Immature Granulocytes 0.12 (*)    All other components within normal limits  COMPREHENSIVE METABOLIC PANEL - Abnormal; Notable for the following components:   Chloride 96 (*)    Glucose, Bld 143 (*)    AST 82 (*)    ALT 63 (*)    All other components within normal limits  ACETAMINOPHEN LEVEL - Abnormal; Notable for the following components:   Acetaminophen (Tylenol), Serum <10 (*)    All other components within normal limits  SALICYLATE LEVEL - Abnormal; Notable for the following components:   Salicylate Lvl <7.0 (*)    All other components within normal limits  BLOOD GAS, ARTERIAL - Abnormal; Notable for the following components:   pH, Arterial 7.48 (*)    pCO2 arterial 31 (*)    pO2, Arterial 42 (*)    All other components within normal limits  LACTIC ACID, PLASMA - Abnormal; Notable for the following components:   Lactic Acid, Venous 4.4 (*)    All other components within normal limits  TROPONIN I (HIGH SENSITIVITY) - Abnormal; Notable for the following components:   Troponin I (High Sensitivity) 352 (*)    All other components within normal limits  RESP PANEL BY RT-PCR (FLU A&B, COVID) ARPGX2  CULTURE, BLOOD (ROUTINE X 2)  CULTURE, BLOOD (ROUTINE X 2)  MRSA NEXT GEN BY PCR, NASAL  ETHANOL  BRAIN NATRIURETIC PEPTIDE  PROCALCITONIN  URINALYSIS, COMPLETE (UACMP) WITH MICROSCOPIC  URINE DRUG SCREEN, QUALITATIVE ([facility] ONLY)  LACTIC ACID, PLASMA  HIV ANTIBODY (ROUTINE TESTING W REFLEX)  CORTISOL  PROTIME-INR  PROCALCITONIN  APTT  TYPE AND SCREEN   TROPONIN I (HIGH SENSITIVITY)   ____________________________________________  EKG  ED ECG REPORT I, Kaylee Wombles J, the attending physician, personally viewed and interpreted this ECG.   Date: 09/27/2020  EKG Time: 0458  Rate: 144  Rhythm: sinus tachycardia  Axis: Normal  Intervals:none  ST&T Change: Nonspecific  ____________________________________________  RADIOLOGY I, Doniesha Landau J, personally viewed and evaluated these images (plain radiographs) as part of my medical decision making, as well as reviewing the written report by the radiologist.  ED MD interpretation: Extensive airspace disease aspiration versus edema  Official radiology report(s): DG Chest Port 1 View  Result Date: 09/27/2020 CLINICAL DATA:  Overdose EXAM: PORTABLE CHEST 1 VIEW COMPARISON:  None. FINDINGS: Hazy airspace opacification of the right more than left chest. No  visible effusion. No Kerley lines. Negative for pneumothorax. Normal heart size. IMPRESSION: Extensive airspace disease on the right more than left with history suggesting aspiration or noncardiogenic edema. Electronically Signed   By: Marnee Spring M.D.   On: 09/27/2020 05:30    ____________________________________________   PROCEDURES  Procedure(s) performed (including Critical Care):  .1-3 Lead EKG Interpretation  Date/Time: 09/27/2020 5:09 AM Performed by: Irean Hong, MD Authorized by: Irean Hong, MD     Interpretation: abnormal     ECG rate:  150   ECG rate assessment: tachycardic     Rhythm: sinus tachycardia     Ectopy: none     Conduction: normal   Comments:     Patient placed on cardiac monitor to evaluate for arrhythmias   CRITICAL CARE Performed by: Irean Hong   Total critical care time: 60 minutes  Critical care time was exclusive of separately billable procedures and treating other patients.  Critical care was necessary to treat or prevent imminent or life-threatening deterioration.  Critical care was  time spent personally by me on the following activities: development of treatment plan with patient and/or surrogate as well as nursing, discussions with consultants, evaluation of patient's response to treatment, examination of patient, obtaining history from patient or surrogate, ordering and performing treatments and interventions, ordering and review of laboratory studies, ordering and review of radiographic studies, pulse oximetry and re-evaluation of patient's condition.  ____________________________________________   INITIAL IMPRESSION / ASSESSMENT AND PLAN / ED COURSE  As part of my medical decision making, I reviewed the following data within the electronic MEDICAL RECORD NUMBER Nursing notes reviewed and incorporated, Labs reviewed, EKG interpreted, Old chart reviewed, Radiograph reviewed, Discussed with admitting physician, and Notes from prior ED visits     25 year old male presenting with accidental overdose, tachycardic, hypoxic. Differential includes, but is not limited to, viral syndrome, bronchitis including COPD exacerbation, pneumonia, reactive airway disease including asthma, CHF including exacerbation with or without pulmonary/interstitial edema, pneumothorax, ACS, thoracic trauma, and pulmonary embolism.   Will obtain lab work, chest x-ray, IV benzodiazepine, IV fluid resuscitation; placed on BiPAP.  Anticipate hospitalization.  Clinical Course as of 09/27/20 8938  Wed Sep 27, 2020  1017 Chest x-ray looks like infiltrate/pulmonary edema, right> left.  Will obtain blood cultures, lactic acid, procalcitonin and cover empirically with broad-spectrum IV antibiotics for aspiration pneumonia.  Blood pressure is marginal, SBP 98 so I believe patient does need IV fluid bolus.  Breathing is better on BiPAP.  Have discussed case with CCU NP [place] who will evaluate patient in the emergency department for admission. [JS]  0557 Elevated troponin noted; likely secondary to demand ischemia. [JS]   [id number] Lactic acid and procalcitonin are both elevated with unremarkable BNP.  CCU NP ordering sepsis protocol fluids.  ED code sepsis order set not utilized for this reason as patient already receiving IV fluids and antibiotics.  Order for code sepsis entered. [JS]    Clinical Course User Index [JS] Irean Hong, MD     ____________________________________________   FINAL CLINICAL IMPRESSION(S) / ED DIAGNOSES  Final diagnoses:  Hypoxia  Accidental drug overdose, initial encounter  Sepsis with acute hypoxic respiratory failure and septic shock, due to unspecified organism [facility])  Aspiration pneumonia of right lung, unspecified aspiration pneumonia type, unspecified part of lung (HCC)  Elevated troponin     ED Discharge Orders     None        Note:  This document was prepared using Dragon voice  recognition software and may include unintentional dictation errors.    Irean Hong, MD 09/27/20 [phone]

## 2020-09-28 DIAGNOSIS — J690 Pneumonitis due to inhalation of food and vomit: Secondary | ICD-10-CM | POA: Diagnosis not present

## 2020-09-28 DIAGNOSIS — B348 Other viral infections of unspecified site: Secondary | ICD-10-CM | POA: Diagnosis not present

## 2020-09-28 DIAGNOSIS — J9601 Acute respiratory failure with hypoxia: Secondary | ICD-10-CM | POA: Diagnosis not present

## 2020-09-28 LAB — BASIC METABOLIC PANEL
Anion gap: 9 (ref 5–15)
BUN: 10 mg/dL (ref 6–20)
CO2: 27 mmol/L (ref 22–32)
Calcium: 8.5 mg/dL — ABNORMAL LOW (ref 8.9–10.3)
Chloride: 102 mmol/L (ref 98–111)
Creatinine, Ser: 0.82 mg/dL (ref 0.61–1.24)
GFR, Estimated: >60 mL/min (ref >60)
Glucose, Bld: 116 mg/dL — ABNORMAL HIGH (ref 70–99)
Potassium: 3.8 mmol/L (ref 3.5–5.1)
Sodium: 138 mmol/L (ref 135–145)

## 2020-09-28 LAB — TROPONIN I (HIGH SENSITIVITY): Troponin I (High Sensitivity): 66 ng/L — ABNORMAL HIGH (ref <18)

## 2020-09-28 LAB — CBC
HCT: 38.8 % — ABNORMAL LOW (ref 39.0–52.0)
Hemoglobin: 13.4 g/dL (ref 13.0–17.0)
MCH: 30.5 pg (ref 26.0–34.0)
MCHC: 34.5 g/dL (ref 30.0–36.0)
MCV: 88.2 fL (ref 80.0–100.0)
Platelets: 245 10*3/uL (ref 150–400)
RBC: 4.4 MIL/uL (ref 4.22–5.81)
RDW: 11.8 % (ref 11.5–15.5)
WBC: 16.2 10*3/uL — ABNORMAL HIGH (ref 4.0–10.5)
nRBC: 0 % (ref 0.0–0.2)

## 2020-09-28 LAB — MAGNESIUM: Magnesium: 2 mg/dL (ref 1.7–2.4)

## 2020-09-28 LAB — PHOSPHORUS: Phosphorus: 1.2 mg/dL — ABNORMAL LOW (ref 2.5–4.6)

## 2020-09-28 MED ORDER — K PHOS MONO-SOD PHOS DI & MONO 155-852-130 MG PO TABS
500.0000 mg | ORAL_TABLET | Freq: Two times a day (BID) | ORAL | Status: AC
  Administered 2020-09-28 (×2): 500 mg via ORAL
  Filled 2020-09-28 (×2): qty 2

## 2020-09-28 MED ORDER — BUSPIRONE HCL 5 MG PO TABS
5.0000 mg | ORAL_TABLET | Freq: Three times a day (TID) | ORAL | Status: DC
  Filled 2020-09-28: qty 1

## 2020-09-28 MED ORDER — TRAZODONE HCL 50 MG PO TABS
50.0000 mg | ORAL_TABLET | Freq: Every evening | ORAL | Status: DC | PRN
  Administered 2020-09-28: 50 mg via ORAL
  Filled 2020-09-28: qty 1

## 2020-09-28 MED ORDER — CLONAZEPAM 1 MG PO TABS
1.0000 mg | ORAL_TABLET | Freq: Three times a day (TID) | ORAL | Status: DC | PRN
  Administered 2020-09-28: 1 mg via ORAL
  Filled 2020-09-28: qty 1

## 2020-09-28 MED ORDER — FAMOTIDINE 20 MG PO TABS
20.0000 mg | ORAL_TABLET | Freq: Two times a day (BID) | ORAL | Status: DC
  Administered 2020-09-28 (×2): 20 mg via ORAL
  Filled 2020-09-28 (×2): qty 1

## 2020-09-28 MED ORDER — LORAZEPAM 2 MG/ML IJ SOLN
2.0000 mg | INTRAMUSCULAR | Status: DC | PRN
  Administered 2020-09-28: 2 mg via INTRAVENOUS
  Filled 2020-09-28: qty 1

## 2020-09-28 MED ORDER — CLONAZEPAM 1 MG PO TABS: 1.0000 mg | ORAL_TABLET | Freq: Once | ORAL | Status: DC

## 2020-09-28 MED ORDER — CLONAZEPAM 1 MG PO TABS
2.0000 mg | ORAL_TABLET | Freq: Three times a day (TID) | ORAL | Status: DC | PRN
  Administered 2020-09-28 – 2020-09-29 (×2): 2 mg via ORAL
  Filled 2020-09-28 (×2): qty 2

## 2020-09-28 MED ORDER — CLONAZEPAM 1 MG PO TABS
1.0000 mg | ORAL_TABLET | Freq: Once | ORAL | Status: AC
  Administered 2020-09-28: 1 mg via ORAL
  Filled 2020-09-28: qty 1
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
## 2020-09-28 NOTE — Plan of Care (Deleted)
MICU Attestation  Patient seen and examined and relevant ancillary tests reviewed.  I agree with the assessment and plan of care as outlined by Harlon Ditty, NP. This patient was not seen as a shared visit. The following reflects my independent critical care time.  [name] is here for acute hypoxic respiratory failure in the setting of R>L pulmonary opacities suggestive of multifocal pneumonia, presumably related to large-volume aspiration. RVP positive for parainfluenza 3. He has been successfully weaned to nasal cannula. TTE showed normal EF without evidence for valvular vegetations.  Blood pressure (!) 129/109, pulse (!) 106, temperature 99.3 F (37.4 C), temperature source Axillary, resp. rate (!) 27, height 5\' 9"  (1.753 m), weight 88.9 kg, SpO2 99 %.   Intake/Output Summary (Last 24 hours) at 09/28/2020 0917 Last data filed at 09/28/2020 0600 Gross per 24 hour  Intake 699.93 ml  Output 4125 ml  Net -3425.07 ml    On exam he is awake, alert and oriented. Lungs are decreased at the right base, otherwise clear. Cardiac exam wnl. Saturating comfortably on 6L/min via nasal cannula.  Impression/Plan: Acute hypoxic respiratory failure Sepsis 2/2 parainfluenza 3, aspiration pneumonia Substance abuse history Severe anxiety Troponinemia Elevated LFTs  - Continue CAP coverage; f/u culture data - Pulmonary toileting - Wean FiO2 for goal SpO2 >=92% - OOB, ambulate as able - PRN Vistaril for anxiety, he declined Buspar this AM - Continue suboxone per home  This patient is critically ill with acute hypoxic respiratory failure; which, requires frequent high complexity decision making, assessment, support, evaluation, and titration of therapies. This was completed through the application of advanced monitoring technologies and extensive interpretation of multiple databases. During this encounter critical care time was devoted to patient care services described in this note for 35  minutes.  09/30/2020, MD 09/28/20 9:17 AM

## 2020-09-29 DIAGNOSIS — J690 Pneumonitis due to inhalation of food and vomit: Secondary | ICD-10-CM

## 2020-09-29 MED ORDER — AMOXICILLIN-POT CLAVULANATE 875-125 MG PO TABS
1.0000 | ORAL_TABLET | Freq: Two times a day (BID) | ORAL | Status: DC
  Administered 2020-09-29: 1 via ORAL
  Filled 2020-09-29 (×2): qty 1

## 2020-09-29 MED ORDER — AMOXICILLIN-POT CLAVULANATE 875-125 MG PO TABS: 1.0000 | ORAL_TABLET | Freq: Two times a day (BID) | ORAL | 0 refills | Status: DC

## 2020-09-29 NOTE — TOC Initial Note (Addendum)
Transition of Care [facility]) - Initial/Assessment Note    Patient Details  Name: Zachary Ballard MRN: [id number] Date of Birth: [birth year]  Transition of Care [facility]) CM/SW Contact:    Marina Goodell Phone Number: [phone] 09/29/2020, 8:31 AM  Clinical Narrative:                  Patient present to [facility] due to "breathing strange" after cocaine use.  Patient was moved to the ICU w/ Bipap and then moved to 6L nasal cannula.  Patient is A/O X4.  Main contact   Ashley,Thomas Child psychotherapist) [phone] [facility] Phone) and Lissa Hoard (mother) [phone]. Patient will discharge home with substance abuse and mental health community resources.  Expected Discharge Plan: Home/Self Care Barriers to Discharge: No Barriers Identified   Patient Goals and CMS Choice Patient states their goals for this hospitalization and ongoing recovery are:: To get better   Choice offered to / list presented to : Patient  Expected Discharge Plan and Services Expected Discharge Plan: Home/Self Care In-house Referral: Clinical Social Work     Living arrangements for the past 2 months: Single Family Home Expected Discharge Date: 09/29/20                                    Prior Living Arrangements/Services Living arrangements for the past 2 months: Single Family Home Lives with:: Relatives Patient language and need for interpreter reviewed:: Yes        Need for Family Participation in Patient Care: Yes (Comment) Care giver support system in place?: Yes (comment)   Criminal Activity/Legal Involvement Pertinent to Current Situation/Hospitalization: No - Comment as needed  Activities of Daily Living      Permission Sought/Granted Permission sought to share information with : Facility Medical sales representative    Share Information with NAME: Franklyn Lor Child psychotherapist)   [phone] (Home Phone)           Emotional Assessment Appearance:: Appears stated age Attitude/Demeanor/Rapport:  Engaged Affect (typically observed): Stable Orientation: : Oriented to Self, Oriented to Place, Oriented to  Time, Oriented to Situation Alcohol / Substance Use: Illicit Drugs Psych Involvement: No (comment)  Admission diagnosis:  Accidental drug overdose [T50.901A] Hypoxia [R09.02] Elevated troponin [R77.8] Accidental drug overdose, initial encounter [T50.901A] Aspiration pneumonia of right lung, unspecified aspiration pneumonia type, unspecified part of lung (HCC) [J69.0] Sepsis with acute hypoxic respiratory failure and septic shock, due to unspecified organism (HCC) [A41.9, R65.21, J96.01] Patient Active Problem List   Diagnosis Date Noted   Accidental drug overdose 09/27/2020   PCP:  Patient, No Pcp Per (Inactive) Pharmacy:   [id number] - [place], [place] - [address] [place] [address] Phone: [phone] Fax: [phone]     Social Determinants of Health (SDOH) Interventions    Readmission Risk Interventions No flowsheet data found.

## 2020-09-29 NOTE — Discharge Summary (Signed)
Zachary Ballard TGG:[id number] DOB: 23-Oct-1995 DOA: 09/27/2020  PCP: Patient, No Pcp Per (Inactive)  Admit date: 09/27/2020 Discharge date: 09/29/2020  Time spent: 35 minutes  Recommendations for Outpatient Follow-up:  Close f/u with psychiatry Substance abuse treatment     Discharge Diagnoses:  Active Problems:   Accidental drug overdose   Discharge Condition: stable  Diet recommendation: regular  Filed Weights   09/27/20 0503 09/27/20 1004 09/28/20 0500  Weight: 70.3 kg 85.2 kg 88.9 kg    History of present illness:  25 Y.O male with significant PMHx of polysubstance abuse, panic attacks and \\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   Patient is currently on BiPAP machine so history mostly obtained from patient's chart.  Per ED notes, patient just got out of rehab for substance abuse and was found by family members at home breathing strangely. EMS run sheet indicate that patient was found  laying in a camper by family "breathing funny". Patient states that he snorted what he thought was fentanyl, but does not believe it was. Family administered Narcan prior to arrival of EMS. Patient states that he feels like he is having a panic attack at this time.  EMS recorded initial AT 04:23: 156/70 9 mm per Hg, heart rate 158 bpm, respirations between 48 and 54 breaths/min.  Patient was transported to the ED for further evaluation.  Hospital Course:  Hypoxic respiratory failure 2/2 accidental drug overdose and aspiration pneumonia. Also parainfluenza positive. Treated with bipap, weaned to room air. Troponin elevation to 300s likely demand. Made rapid recovery. Will need close f/u with his psychiatrist. SW to provide mental health and substance abuse resources (pt does see a tele-psychiatrist).  Procedures: none   Consultations: none  Discharge Exam: Vitals:   09/28/20 1900 09/28/20 2200  BP:    Pulse: (!) 126    Resp: 19   Temp:  98.8 F (37.1 C)  SpO2: 97%     General: NAD Cardiovascular: mild tachycardia, no murmur Respiratory: CTAB  Discharge Instructions   Discharge Instructions     Diet general   Complete by: As directed    Increase activity slowly   Complete by: As directed       Allergies as of 09/29/2020   No Known Allergies      Medication List     STOP taking these medications    Suboxone 8-2 MG Film Generic drug: Buprenorphine HCl-Naloxone HCl       TAKE these medications    amoxicillin-clavulanate 875-125 MG tablet Commonly known as: AUGMENTIN Take 1 tablet by mouth 2 (two) times daily.   hydrOXYzine 50 MG capsule Commonly known as: VISTARIL Take 100 mg by mouth 3 (three) times daily as needed.       No Known Allergies    The results of significant diagnostics from this hospitalization (including imaging, microbiology, ancillary and laboratory) are listed below for reference.    Significant Diagnostic Studies: CT Angio Chest Pulmonary Embolism (PE) W or WO Contrast  Result Date: 09/27/2020 CLINICAL DATA:  Drug overdose. EXAM: CT ANGIOGRAPHY CHEST WITH CONTRAST TECHNIQUE: Multidetector CT imaging of the chest was performed using the standard protocol during bolus administration of intravenous contrast. Multiplanar CT image reconstructions and MIPs were obtained to evaluate the vascular anatomy. CONTRAST:  84mL OMNIPAQUE IOHEXOL 350 MG/ML SOLN COMPARISON:  Chest x-ray from same day. FINDINGS: Cardiovascular: Satisfactory opacification of the pulmonary arteries to the segmental level. No evidence of pulmonary  embolism. Normal heart size. No pericardial effusion. No thoracic aortic aneurysm or dissection. Mediastinum/Nodes: No enlarged mediastinal, hilar, or axillary lymph nodes. Thyroid gland, trachea, and esophagus demonstrate no significant findings. Lungs/Pleura: Bilateral peribronchovascular ground-glass densities in the perihilar and posterior right  greater than left lower lobes. More dense consolidation in the right greater than left lower lobes. No pleural effusion or pneumothorax. Upper Abdomen: No acute abnormality. Musculoskeletal: No chest wall abnormality. No acute or significant osseous findings. Review of the MIP images confirms the above findings. IMPRESSION: 1. No evidence of pulmonary embolism. 2. Bilateral peribronchovascular ground-glass densities in the perihilar and posterior right greater than left lower lobes with more dense consolidation in the right greater than left lower lobes, concerning for aspiration pneumonia. Electronically Signed   By: Obie Dredge M.D.   On: 09/27/2020 14:06   DG Chest Port 1 View  Result Date: 09/27/2020 CLINICAL DATA:  Overdose EXAM: PORTABLE CHEST 1 VIEW COMPARISON:  None. FINDINGS: Hazy airspace opacification of the right more than left chest. No visible effusion. No Kerley lines. Negative for pneumothorax. Normal heart size. IMPRESSION: Extensive airspace disease on the right more than left with history suggesting aspiration or noncardiogenic edema. Electronically Signed   By: Marnee Spring M.D.   On: 09/27/2020 05:30   ECHOCARDIOGRAM COMPLETE BUBBLE STUDY  Result Date: 09/27/2020    ECHOCARDIOGRAM REPORT   [name] Date of Exam: 09/27/2020 Medical Rec #:  [id number]       Height:       69.0 in Accession #:    [id number]      Weight:       187.8 lb Date of Birth:  [birth year]       BSA:          2.012 m Patient Age:    24 years        BP:           112/68 mmHg Patient Gender: M               HR:           105 bpm. Exam Location:  [facility] Procedure: 2D Echo, Cardiac Doppler, Color Doppler and Saline Contrast Bubble            Study Indications:     Elevated troponin  History:         Patient has no prior history of Echocardiogram examinations.                  Panic attacks.  Sonographer:     Cristela Blue RDCS (AE) Referring Phys:  [id number] ADAM ROSS SCHERTZ Diagnosing Phys: Debbe Odea MD  Sonographer Comments: Suboptimal apical window. Image acquisition challenging due to patient body habitus. IMPRESSIONS  1. Left ventricular ejection fraction, by estimation, is 50 to 55%. The left ventricle has low normal function. The left ventricle has no regional wall motion abnormalities. Left ventricular diastolic parameters were normal.  2. Right ventricular systolic function is mildly reduced. The right ventricular size is not well visualized.  3. The mitral valve is normal in structure. No evidence of mitral valve regurgitation.  4. The aortic valve is grossly normal. Aortic valve regurgitation is not visualized.  5. Agitated saline contrast bubble study was negative, with no evidence of any interatrial shunt. FINDINGS  Left Ventricle: Left ventricular ejection fraction, by estimation, is 50 to 55%. The left ventricle has low normal function. The left ventricle has no regional wall  motion abnormalities. The left ventricular internal cavity size was normal in size. There is no left ventricular hypertrophy. Left ventricular diastolic parameters were normal. Right Ventricle: The right ventricular size is not well visualized. No increase in right ventricular wall thickness. Right ventricular systolic function is mildly reduced. Left Atrium: Left atrial size was normal in size. Right Atrium: Right atrial size was normal in size. Pericardium: There is no evidence of pericardial effusion. Mitral Valve: The mitral valve is normal in structure. No evidence of mitral valve regurgitation. Tricuspid Valve: The tricuspid valve is normal in structure. Tricuspid valve regurgitation is not demonstrated. Aortic Valve: The aortic valve is grossly normal. Aortic valve regurgitation is not visualized. Aortic valve mean gradient measures 2.0 mmHg. Aortic valve peak gradient measures 3.3 mmHg. Aortic valve area, by VTI measures 2.89 cm. Pulmonic Valve: The pulmonic valve was not well visualized. Pulmonic valve  regurgitation is not visualized. Aorta: The aortic root is normal in size and structure. Venous: The inferior vena cava was not well visualized. IAS/Shunts: No atrial level shunt detected by color flow Doppler. Agitated saline contrast was given intravenously to evaluate for intracardiac shunting. Agitated saline contrast bubble study was negative, with no evidence of any interatrial shunt.  LEFT VENTRICLE PLAX 2D LVIDd:         4.53 cm  Diastology LVIDs:         3.31 cm  LV e' medial:    7.83 cm/s LV PW:         0.85 cm  LV E/e' medial:  8.0 LV IVS:        0.72 cm  LV e' lateral:   14.30 cm/s LVOT diam:     2.00 cm  LV E/e' lateral: 4.4 LV SV:         43 LV SV Index:   22 LVOT Area:     3.14 cm  RIGHT VENTRICLE RV Basal diam:  3.92 cm RV S prime:     11.30 cm/s TAPSE (M-mode): 2.8 cm LEFT ATRIUM           Index       RIGHT ATRIUM           Index LA diam:      2.90 cm 1.44 cm/m  RA Area:     14.90 cm LA Vol (A2C): 24.6 ml 12.23 ml/m RA Volume:   41.80 ml  20.78 ml/m LA Vol (A4C): 17.0 ml 8.45 ml/m  AORTIC VALVE                   PULMONIC VALVE AV Area (Vmax):    2.75 cm    PV Vmax:        0.67 m/s AV Area (Vmean):   2.63 cm    PV Peak grad:   1.8 mmHg AV Area (VTI):     2.89 cm    RVOT Peak grad: 3 mmHg AV Vmax:           90.40 cm/s AV Vmean:          62.500 cm/s AV VTI:            0.150 m AV Peak Grad:      3.3 mmHg AV Mean Grad:      2.0 mmHg LVOT Vmax:         79.00 cm/s LVOT Vmean:        52.300 cm/s LVOT VTI:          0.138 m LVOT/AV VTI ratio:  0.92  AORTA Ao Root diam: 2.40 cm MITRAL VALVE               TRICUSPID VALVE MV Area (PHT): 3.99 cm    TR Peak grad:   11.7 mmHg MV Decel Time: 190 msec    TR Vmax:        171.00 cm/s MV E velocity: 63.00 cm/s MV A velocity: 68.10 cm/s  SHUNTS MV E/A ratio:  0.93        Systemic VTI:  0.14 m                            Systemic Diam: 2.00 cm Debbe Odea MD Electronically signed by Debbe Odea MD Signature Date/Time: 09/27/2020/3:45:25 PM    Final      Microbiology: Recent Results (from the past 240 hour(s))  Resp Panel by RT-PCR (Flu A&B, Covid) Nasopharyngeal Swab     Status: None   Collection Time: 09/27/20  5:06 AM   Specimen: Nasopharyngeal Swab; Nasopharyngeal(NP) swabs in vial transport medium  Result Value Ref Range Status   SARS Coronavirus 2 by RT PCR NEGATIVE NEGATIVE Final    Comment: (NOTE) SARS-CoV-2 target nucleic acids are NOT DETECTED.  The SARS-CoV-2 RNA is generally detectable in upper respiratory specimens during the acute phase of infection. The lowest concentration of SARS-CoV-2 viral copies this assay can detect is 138 copies/mL. A negative result does not preclude SARS-Cov-2 infection and should not be used as the sole basis for treatment or other patient management decisions. A negative result may occur with  improper specimen collection/handling, submission of specimen other than nasopharyngeal swab, presence of viral mutation(s) within the areas targeted by this assay, and inadequate number of viral copies(<138 copies/mL). A negative result must be combined with clinical observations, patient history, and epidemiological information. The expected result is Negative.  Fact Sheet for Patients:  BloggerCourse.com  Fact Sheet for Healthcare Providers:  SeriousBroker.it  This test is no t yet approved or cleared by the [place] FDA and  has been authorized for detection and/or diagnosis of SARS-CoV-2 by FDA under an Emergency Use Authorization (EUA). This EUA will remain  in effect (meaning this test can be used) for the duration of the COVID-19 declaration under Section 564(b)(1) of the Act, 21 U.S.C.section 360bbb-3(b)(1), unless the authorization is terminated  or revoked sooner.       Influenza A by PCR NEGATIVE NEGATIVE Final   Influenza B by PCR NEGATIVE NEGATIVE Final    Comment: (NOTE) The Xpert Xpress SARS-CoV-2/FLU/RSV plus assay is  intended as an aid in the diagnosis of influenza from Nasopharyngeal swab specimens and should not be used as a sole basis for treatment. Nasal washings and aspirates are unacceptable for Xpert Xpress SARS-CoV-2/FLU/RSV testing.  Fact Sheet for Patients: BloggerCourse.com  Fact Sheet for Healthcare Providers: SeriousBroker.it  This test is not yet approved or cleared by the [place] FDA and has been authorized for detection and/or diagnosis of SARS-CoV-2 by FDA under an Emergency Use Authorization (EUA). This EUA will remain in effect (meaning this test can be used) for the duration of the COVID-19 declaration under Section 564(b)(1) of the Act, 21 U.S.C. section 360bbb-3(b)(1), unless the authorization is terminated or revoked.  Performed at [facility], [address] [place], [place] [address]   Culture, blood (routine x 2)     Status: None (Preliminary result)   Collection Time: 09/27/20  5:36 AM   Specimen:  BLOOD  Result Value Ref Range Status   Specimen Description BLOOD LEFT AC  Final   Special Requests   Final    BOTTLES DRAWN AEROBIC AND ANAEROBIC Blood Culture results may not be optimal due to an excessive volume of blood received in culture bottles   Culture   Final    NO GROWTH 2 DAYS Performed at [facility], [address] [place], [place] [address]    Report Status PENDING  Incomplete  Culture, blood (routine x 2)     Status: None (Preliminary result)   Collection Time: 09/27/20  5:36 AM   Specimen: BLOOD  Result Value Ref Range Status   Specimen Description BLOOD RIGHT HAND  Final   Special Requests   Final    BOTTLES DRAWN AEROBIC AND ANAEROBIC Blood Culture adequate volume   Culture   Final    NO GROWTH 2 DAYS Performed at [facility], [address] [place], [place] [address]    Report Status PENDING  Incomplete  MRSA Next Gen by PCR, Nasal     Status: None    Collection Time: 09/27/20 10:09 AM   Specimen: Nasal Mucosa; Nasal Swab  Result Value Ref Range Status   MRSA by PCR Next Gen NOT DETECTED NOT DETECTED Final    Comment: (NOTE) The GeneXpert MRSA Assay (FDA approved for NASAL specimens only), is one component of a comprehensive MRSA colonization surveillance program. It is not intended to diagnose MRSA infection nor to guide or monitor treatment for MRSA infections. Test performance is not FDA approved in patients less than 23 years old. Performed at [facility], [address] [place], [place] [address]   Respiratory (~20 pathogens) panel by PCR     Status: Abnormal   Collection Time: 09/27/20  1:48 PM   Specimen: SPU; Respiratory  Result Value Ref Range Status   Adenovirus NOT DETECTED NOT DETECTED Final   Coronavirus 229E NOT DETECTED NOT DETECTED Final    Comment: (NOTE) The Coronavirus on the Respiratory Panel, DOES NOT test for the novel  Coronavirus (2019 nCoV)    Coronavirus HKU1 NOT DETECTED NOT DETECTED Final   Coronavirus NL63 NOT DETECTED NOT DETECTED Final   Coronavirus OC43 NOT DETECTED NOT DETECTED Final   Metapneumovirus NOT DETECTED NOT DETECTED Final   Rhinovirus / Enterovirus NOT DETECTED NOT DETECTED Final   Influenza A NOT DETECTED NOT DETECTED Final   Influenza B NOT DETECTED NOT DETECTED Final   Parainfluenza Virus 1 NOT DETECTED NOT DETECTED Final   Parainfluenza Virus 2 NOT DETECTED NOT DETECTED Final   Parainfluenza Virus 3 DETECTED (A) NOT DETECTED Final   Parainfluenza Virus 4 NOT DETECTED NOT DETECTED Final   Respiratory Syncytial Virus NOT DETECTED NOT DETECTED Final   Bordetella pertussis NOT DETECTED NOT DETECTED Final   Bordetella Parapertussis NOT DETECTED NOT DETECTED Final   Chlamydophila pneumoniae NOT DETECTED NOT DETECTED Final   Mycoplasma pneumoniae NOT DETECTED NOT DETECTED Final    Comment: Performed at [facility], [address] [place], [place] [address]   Expectorated Sputum Assessment w Gram Stain, Rflx to Resp Cult     Status: None   Collection Time: 09/27/20  1:48 PM   Specimen: Sputum  Result Value Ref Range Status   Specimen Description SPUTUM  Final   Special Requests NONE  Final   Sputum evaluation   Final    THIS SPECIMEN IS ACCEPTABLE FOR SPUTUM CULTURE Performed at [facility], [address], [place], [place] [address]    Report Status 09/27/2020 FINAL  Final  Culture, Respiratory w Gram Stain     Status: None (Preliminary result)   Collection Time: 09/27/20  1:48 PM   Specimen: SPU  Result Value Ref Range Status   Specimen Description   Final    SPUTUM Performed at [facility], [address], [place], [place] [address]    Special Requests   Final    NONE Reflexed from [id number] Performed at [facility], [address] [place], [place] [address]    Gram Stain   Final    MODERATE WBC PRESENT, PREDOMINANTLY PMN RARE GRAM POSITIVE RODS    Culture   Final    CULTURE REINCUBATED FOR BETTER GROWTH Performed at [facility], [address], [place], [place] [address]    Report Status PENDING  Incomplete     Labs: Basic Metabolic Panel: Recent Labs  Lab 09/27/20 0507 09/28/20 0416  NA 136 138  K 3.8 3.8  CL 96* 102  CO2 25 27  GLUCOSE 143* 116*  BUN 12 10  CREATININE 1.12 0.82  CALCIUM 9.0 8.5*  MG  --  2.0  PHOS  --  1.2*   Liver Function Tests: Recent Labs  Lab 09/27/20 0507  AST 82*  ALT 63*  ALKPHOS 125  BILITOT 0.8  PROT 7.3  ALBUMIN 3.9   No results for input(s): LIPASE, AMYLASE in the last 168 hours. No results for input(s): AMMONIA in the last 168 hours. CBC: Recent Labs  Lab 09/27/20 0507 09/28/20 0416  WBC 13.4* 16.2*  NEUTROABS 11.4*  --   HGB 16.5 13.4  HCT 46.4 38.8*  MCV 87.9 88.2  PLT 335 245   Cardiac Enzymes: No results for input(s): CKTOTAL, CKMB, CKMBINDEX, TROPONINI in the last 168 hours. BNP: BNP (last 3 results) Recent Labs     09/27/20 0505  BNP 20.7    ProBNP (last 3 results) No results for input(s): PROBNP in the last 8760 hours.  CBG: Recent Labs  Lab 09/27/20 1000  GLUCAP 128*       Signed:  Silvano Bilis MD.  [facility] 09/29/2020, 8:18 AM

## 2020-09-30 LAB — CULTURE, RESPIRATORY W GRAM STAIN: Culture: NORMAL

## 2020-10-02 LAB — CULTURE, BLOOD (ROUTINE X 2)
Culture: NO GROWTH
Culture: NO GROWTH
Special Requests: ADEQUATE

## 2021-04-17 ENCOUNTER — Emergency Department
Admission: EM | Admit: 2021-04-17 | Discharge: 2021-04-17 | Disposition: A | Discharge status: Home / Self Care | Payer: Medicaid Other | Attending: Emergency Medicine | Admitting: Emergency Medicine

## 2021-04-17 ENCOUNTER — Other Ambulatory Visit: Payer: Self-pay

## 2021-04-17 DIAGNOSIS — F1190 Opioid use, unspecified, uncomplicated: Secondary | ICD-10-CM

## 2021-04-17 DIAGNOSIS — R112 Nausea with vomiting, unspecified: Secondary | ICD-10-CM | POA: Diagnosis present

## 2021-04-17 DIAGNOSIS — F419 Anxiety disorder, unspecified: Secondary | ICD-10-CM | POA: Insufficient documentation

## 2021-04-17 DIAGNOSIS — F1120 Opioid dependence, uncomplicated: Secondary | ICD-10-CM | POA: Insufficient documentation

## 2021-04-17 LAB — COMPREHENSIVE METABOLIC PANEL
ALT: 29 U/L (ref 0–44)
AST: 26 U/L (ref 15–41)
Albumin: 4.7 g/dL (ref 3.5–5.0)
Alkaline Phosphatase: 103 U/L (ref 38–126)
Anion gap: 7 (ref 5–15)
BUN: 15 mg/dL (ref 6–20)
CO2: 30 mmol/L (ref 22–32)
Calcium: 9.4 mg/dL (ref 8.9–10.3)
Chloride: 98 mmol/L (ref 98–111)
Creatinine, Ser: 1.02 mg/dL (ref 0.61–1.24)
GFR, Estimated: >60 mL/min (ref >60)
Glucose, Bld: 100 mg/dL — ABNORMAL HIGH (ref 70–99)
Potassium: 4 mmol/L (ref 3.5–5.1)
Sodium: 135 mmol/L (ref 135–145)
Total Bilirubin: 0.8 mg/dL (ref 0.3–1.2)
Total Protein: 7.9 g/dL (ref 6.5–8.1)

## 2021-04-17 LAB — URINE DRUG SCREEN, QUALITATIVE (ARMC ONLY)
Amphetamines, Ur Screen: NOT DETECTED
Barbiturates, Ur Screen: NOT DETECTED
Benzodiazepine, Ur Scrn: NOT DETECTED
Cannabinoid 50 Ng, Ur ~~LOC~~: POSITIVE — AB
Cocaine Metabolite,Ur ~~LOC~~: NOT DETECTED
MDMA (Ecstasy)Ur Screen: NOT DETECTED
Methadone Scn, Ur: NOT DETECTED
Opiate, Ur Screen: NOT DETECTED
Phencyclidine (PCP) Ur S: NOT DETECTED
Tricyclic, Ur Screen: NOT DETECTED

## 2021-04-17 LAB — CBC
HCT: 46 % (ref 39.0–52.0)
Hemoglobin: 15.9 g/dL (ref 13.0–17.0)
MCH: 29.8 pg (ref 26.0–34.0)
MCHC: 34.6 g/dL (ref 30.0–36.0)
MCV: 86.3 fL (ref 80.0–100.0)
Platelets: 316 10*3/uL (ref 150–400)
RBC: 5.33 MIL/uL (ref 4.22–5.81)
RDW: 10.8 % — ABNORMAL LOW (ref 11.5–15.5)
WBC: 5.8 10*3/uL (ref 4.0–10.5)
nRBC: 0 % (ref 0.0–0.2)

## 2021-04-17 LAB — ETHANOL: Alcohol, Ethyl (B): <10 mg/dL (ref <10)

## 2021-04-17 NOTE — ED Notes (Signed)
EDP at bedside  

## 2021-04-17 NOTE — ED Provider Notes (Signed)
[facility] Provider Note    Event Date/Time   First MD Initiated Contact with Patient 04/17/21 1217     (approximate)   History   Withdrawal   HPI  Zachary Ballard is a 26 y.o. male with past medical history of opiate use disorder who presents with opiate withdrawal.  Patient last used fentanyl which he smokes 24 hours ago.  Patient has tried Suboxone before but it always precipitates withdrawal.  When he has been on it in the past he has to go for 5 days without being on fentanyl.  Tried several days ago after being off for 48 hours and this precipitated withdrawal.  Patient comes the ED because he would like to be started on a methadone taper.  Endorses withdrawal symptoms of anxiety and occasional nausea and vomiting.    Past Medical History:  Diagnosis Date   Family history of adverse reaction to anesthesia    mom itches/ nausea/hard to get to sleep   Panic attacks    Patellar instability of left knee    s/p multiple surgeries by Dr. Alvin Critchley at [facility]    Patient Active Problem List   Diagnosis Date Noted   Accidental drug overdose 09/27/2020     Physical Exam  Triage Vital Signs: ED Triage Vitals  Enc Vitals Group     BP 04/17/21 1212 137/66     Pulse Rate 04/17/21 1212 97     Resp 04/17/21 1212 18     Temp 04/17/21 1212 97.8 F (36.6 C)     Temp Source 04/17/21 1212 Oral     SpO2 04/17/21 1212 100 %     Weight --      Height 04/17/21 1211 5\' 9"  (1.753 m)     Head Circumference --      Peak Flow --      Pain Score 04/17/21 1212 0     Pain Loc --      Pain Edu? --      Excl. in [place]? --     Most recent vital signs: Vitals:   04/17/21 1212  BP: 137/66  Pulse: 97  Resp: 18  Temp: 97.8 F (36.6 C)  SpO2: 100%     General: Awake, no distress.  CV:  Good peripheral perfusion.  Resp:  Normal effort.  Abd:  No distention.  Neuro:             Awake, Alert, Oriented x 3 no tremor Other:  Patient is calm and cooperative, has good  insight   ED Results / Procedures / Treatments  Labs (all labs ordered are listed, but only abnormal results are displayed) Labs Reviewed  COMPREHENSIVE METABOLIC PANEL - Abnormal; Notable for the following components:      Result Value   Glucose, Bld 100 (*)    All other components within normal limits  CBC - Abnormal; Notable for the following components:   RDW 10.8 (*)    All other components within normal limits  URINE DRUG SCREEN, QUALITATIVE ([facility] ONLY) - Abnormal; Notable for the following components:   Cannabinoid 50 Ng, Ur [place] POSITIVE (*)    All other components within normal limits  ETHANOL     EKG     RADIOLOGY    PROCEDURES:  Critical Care performed: No  Procedures  The patient is on the cardiac monitor to evaluate for evidence of arrhythmia and/or significant heart rate changes.   MEDICATIONS ORDERED IN ED: Medications - No data  to display   IMPRESSION / MDM / [place] / ED COURSE  I reviewed the triage vital signs and the nursing notes.                              Differential diagnosis includes, but is not limited to, opiate withdrawal, opiate use disorder  Patient is a 26 year old male with history of opiate use disorder who presents with opiate withdrawal hoping to be started on methadone.  Has had bad experiences with precipitated withdrawal from Suboxone and is not open to being started on it.  He cannot handle the withdrawal symptoms at home.  Patient is not displaying signs of severe withdrawal at this time he is not having any active GI symptoms vital signs are within normal limits.  I reviewed his CBC and CMP which are reassuring.  Unfortunately I am not comfortable nor able to write for methadone do not think that this is appropriate from the emergency department.  Will refer to [place] and advised that he attempt to get into a methadone clinic.  Clinical Course as of 04/17/21 1605  Tue Apr 17, 2021  1257 Cannabinoid 25 Ng, Ur  [place](!): POSITIVE [KM]    Clinical Course User Index [KM] Rada Hay, MD     FINAL CLINICAL IMPRESSION(S) / ED DIAGNOSES   Final diagnoses:  Opioid use disorder     Rx / DC Orders   ED Discharge Orders     None        Note:  This document was prepared using Dragon voice recognition software and may include unintentional dictation errors.   Rada Hay, MD 04/17/21 [id number]

## 2021-04-17 NOTE — ED Notes (Signed)
Pt walked off after being told about discharge by EDP prior to getting paperwork, e signature or repeat VS.

## 2021-04-17 NOTE — ED Triage Notes (Signed)
Pt here for withdrawals from opiates. Pt last use was yesterday. Pt having N/V and generalized body aches. Pt in NAD in triage.

## 2022-04-15 IMAGING — DX DG CHEST 1V PORT
1 series · 1 of 1 positions shown · non-contrast
Comparison: None.

CLINICAL DATA: Overdose

EXAM:
PORTABLE CHEST 1 VIEW

[chest ap]
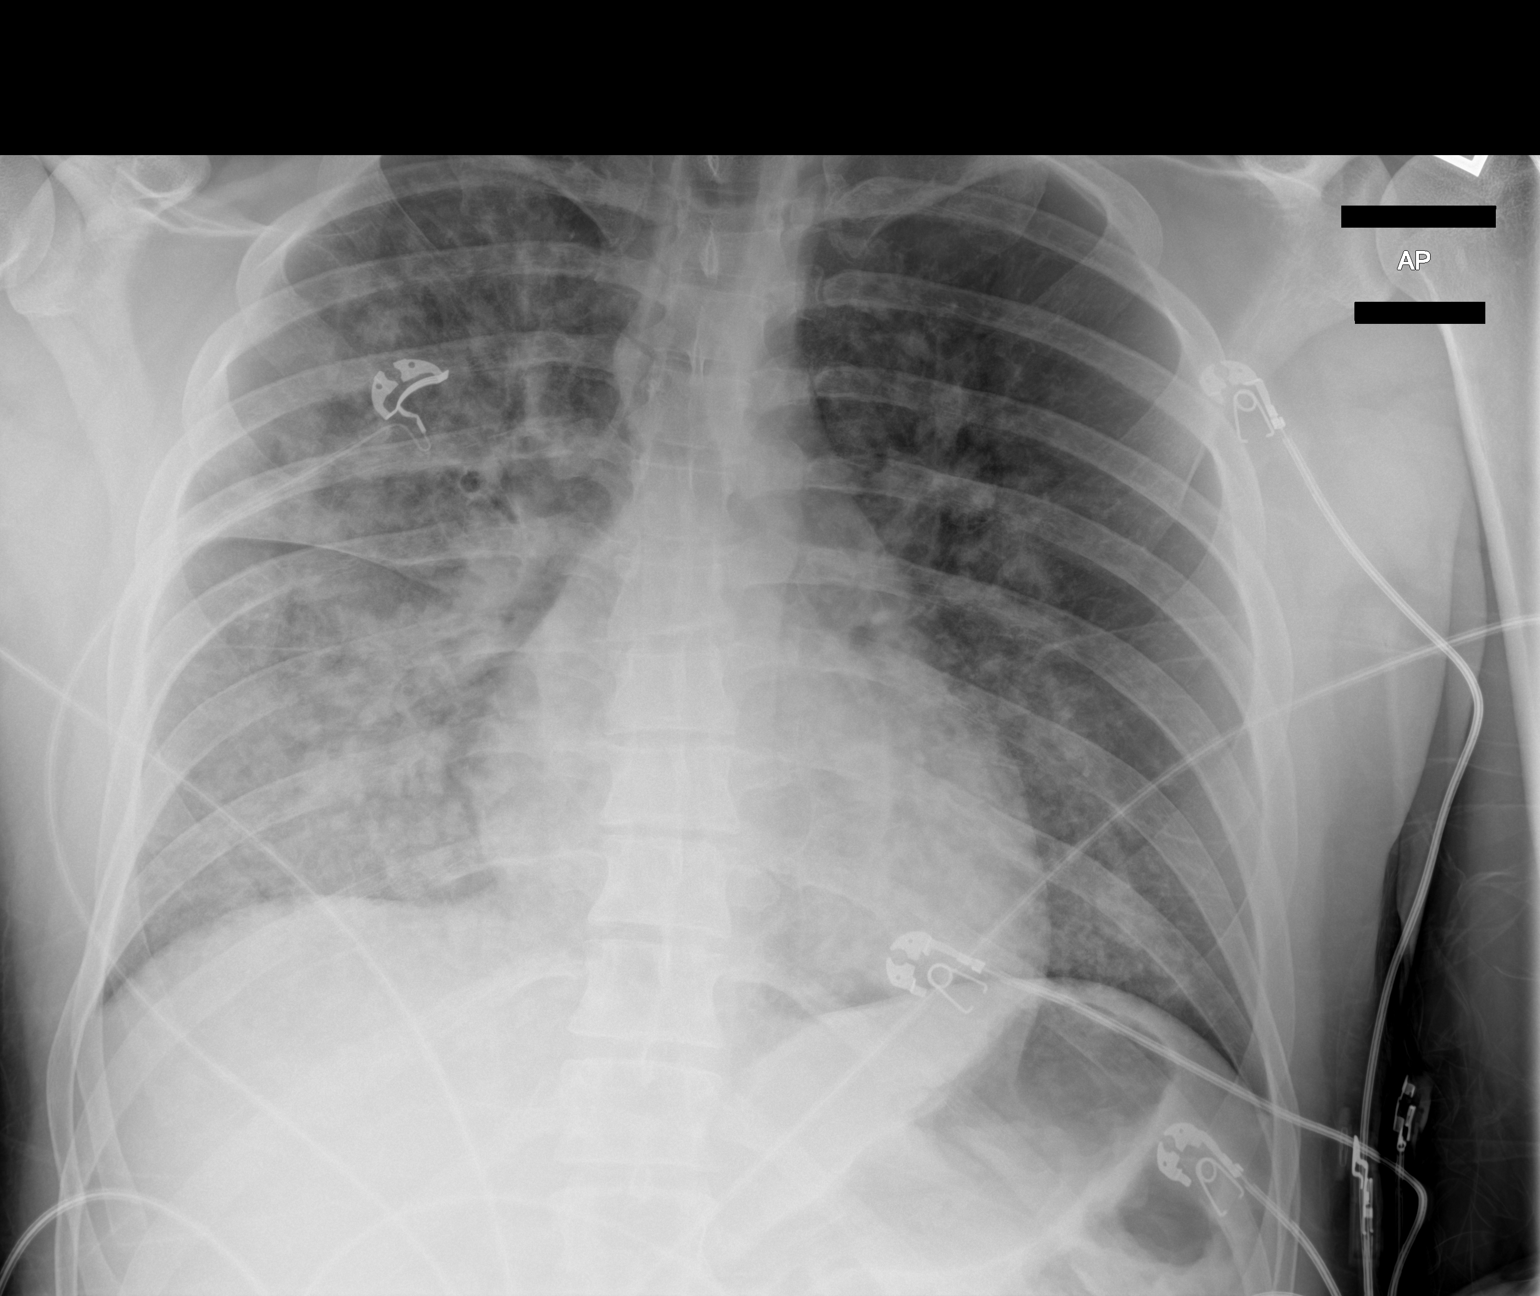

[1 of 1 positions shown; findings below may reference images not displayed]

FINDINGS: Hazy airspace opacification of the right more than left chest. No
visible effusion. No Kerley lines. Negative for pneumothorax. Normal
heart size.
IMPRESSION: Extensive airspace disease on the right more than left with history
suggesting aspiration or noncardiogenic edema.

## 2022-05-27 ENCOUNTER — Emergency Department
Admission: EM | Admit: 2022-05-27 | Discharge: 2022-05-27 | Disposition: A | Discharge status: Home / Self Care | Payer: Medicaid Other | Attending: Emergency Medicine | Admitting: Emergency Medicine

## 2022-05-27 ENCOUNTER — Other Ambulatory Visit: Payer: Self-pay

## 2022-05-27 DIAGNOSIS — F419 Anxiety disorder, unspecified: Secondary | ICD-10-CM | POA: Diagnosis present

## 2022-05-27 DIAGNOSIS — F410 Panic disorder [episodic paroxysmal anxiety] without agoraphobia: Secondary | ICD-10-CM | POA: Insufficient documentation

## 2022-05-27 MED ORDER — LORAZEPAM 1 MG PO TABS
1.0000 mg | ORAL_TABLET | Freq: Once | ORAL | Status: AC
  Administered 2022-05-27: 1 mg via ORAL
  Filled 2022-05-27: qty 1

## 2022-05-27 MED ORDER — LORAZEPAM 2 MG/ML IJ SOLN
1.0000 mg | Freq: Once | INTRAMUSCULAR | Status: AC
  Administered 2022-05-27: 1 mg via INTRAMUSCULAR
  Filled 2022-05-27: qty 1

## 2022-05-27 MED ORDER — LORAZEPAM 1 MG PO TABS: 1.0000 mg | ORAL_TABLET | Freq: Three times a day (TID) | ORAL | 0 refills | Status: DC | PRN

## 2022-05-27 NOTE — ED Provider Notes (Signed)
   [facility] Provider Note    Event Date/Time   First MD Initiated Contact with Patient 05/27/22 1239     (approximate)   History   Anxiety   HPI  Zachary Ballard is a 27 y.o. male with a history of panic attacks who presents with complaints of anxiety.  Patient reports he feels like his whole body is shaking and tingling and he feels scared.  He reports this is consistent with prior panic attacks, he reports this 1 seems to be lasting longer than typical.     Physical Exam   Triage Vital Signs: ED Triage Vitals  Enc Vitals Group     BP 05/27/22 1222 123/82     Pulse Rate 05/27/22 1222 (!) 120     Resp 05/27/22 1222 (!) 22     Temp 05/27/22 1222 98.2 F (36.8 C)     Temp src --      SpO2 05/27/22 1222 98 %     Weight 05/27/22 1223 95.3 kg (210 lb)     Height 05/27/22 1223 1.753 m (5\' 9" )     Head Circumference --      Peak Flow --      Pain Score 05/27/22 1223 4     Pain Loc --      Pain Edu? --      Excl. in [place]? --     Most recent vital signs: Vitals:   05/27/22 1222  BP: 123/82  Pulse: (!) 120  Resp: (!) 22  Temp: 98.2 F (36.8 C)  SpO2: 98%     General: Awake, no distress.  Anxious, tremulous CV:  Good peripheral perfusion.  Mild tachycardia Resp:  Normal effort.  Clear to auscultation bilaterally Abd:  No distention.  Other:  Extremities warm and well-perfused   ED Results / Procedures / Treatments   Labs (all labs ordered are listed, but only abnormal results are displayed) Labs Reviewed - No data to display   EKG     RADIOLOGY     PROCEDURES:  Critical Care performed:   Procedures   MEDICATIONS ORDERED IN ED: Medications  LORazepam (ATIVAN) tablet 1 mg (1 mg Oral Given 05/27/22 1336)  LORazepam (ATIVAN) injection 1 mg (1 mg Intramuscular Given 05/27/22 1458)     IMPRESSION / MDM / [place] / ED COURSE  I reviewed the triage vital signs and the nursing notes. Patient's presentation is  most consistent with exacerbation of chronic illness.  Patient presents with symptoms as above, most consistent with anxiety attack.  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  He states that this feels like a panic attack.  Overall well-appearing and in no acute distress, will trial p.o. Ativan  Patient reports continued feelings of anxiety after Ativan, I am Ativan given, will reevaluate.        FINAL CLINICAL IMPRESSION(S) / ED DIAGNOSES   Final diagnoses:  Anxiety attack     Rx / DC Orders   ED Discharge Orders     None        Note:  This document was prepared using Dragon voice recognition software and may include unintentional dictation errors.   Lavonia Drafts, MD 05/27/22 1550

## 2022-05-27 NOTE — ED Triage Notes (Signed)
Pt to ED via [facility] from home. Pt reports hx of anxiety and has been off meds x1 yr. Pt reports within the last week anxiety has gotten worse. Pt reports within the last day anxiety has gotten worse and passed 4 times with EMS each syncopal episode lasting 5-10secs. Pt given 4mg  zofran IM for nausea. Pt is seen at methadone clinic.   EMS VS:  99% Ra 138/78 BP  22 RR

## 2022-05-27 NOTE — ED Notes (Signed)
Waste of 1mg  Ativan after change of shift with charge RN Lorriane Shire to Flex pyxis @2029 .

## 2022-05-27 NOTE — ED Triage Notes (Signed)
Pt reports here for worsening anxiety and repeated syncopal episodes. Reports has been on methadone and decreasing it. Pt shaking in triage. Able to hold a conversation

## 2022-06-22 ENCOUNTER — Other Ambulatory Visit: Payer: Self-pay

## 2022-06-22 ENCOUNTER — Emergency Department
Admission: EM | Admit: 2022-06-22 | Discharge: 2022-06-22 | Disposition: A | Discharge status: Home / Self Care | Payer: Medicaid Other | Attending: Emergency Medicine | Admitting: Emergency Medicine

## 2022-06-22 DIAGNOSIS — S40862A Insect bite (nonvenomous) of left upper arm, initial encounter: Secondary | ICD-10-CM | POA: Insufficient documentation

## 2022-06-22 DIAGNOSIS — W57XXXA Bitten or stung by nonvenomous insect and other nonvenomous arthropods, initial encounter: Secondary | ICD-10-CM | POA: Diagnosis not present

## 2022-06-22 DIAGNOSIS — F419 Anxiety disorder, unspecified: Secondary | ICD-10-CM | POA: Diagnosis not present

## 2022-06-22 DIAGNOSIS — L03114 Cellulitis of left upper limb: Secondary | ICD-10-CM | POA: Insufficient documentation

## 2022-06-22 LAB — CBC
HCT: 46.9 % (ref 39.0–52.0)
Hemoglobin: 16.2 g/dL (ref 13.0–17.0)
MCH: 29.6 pg (ref 26.0–34.0)
MCHC: 34.5 g/dL (ref 30.0–36.0)
MCV: 85.7 fL (ref 80.0–100.0)
Platelets: 331 10*3/uL (ref 150–400)
RBC: 5.47 MIL/uL (ref 4.22–5.81)
RDW: 11 % — ABNORMAL LOW (ref 11.5–15.5)
WBC: 8.6 10*3/uL (ref 4.0–10.5)
nRBC: 0 % (ref 0.0–0.2)

## 2022-06-22 LAB — BASIC METABOLIC PANEL
Anion gap: 9 (ref 5–15)
BUN: 9 mg/dL (ref 6–20)
CO2: 24 mmol/L (ref 22–32)
Calcium: 9 mg/dL (ref 8.9–10.3)
Chloride: 101 mmol/L (ref 98–111)
Creatinine, Ser: 1.03 mg/dL (ref 0.61–1.24)
GFR, Estimated: >60 mL/min (ref >60)
Glucose, Bld: 135 mg/dL — ABNORMAL HIGH (ref 70–99)
Potassium: 3.2 mmol/L — ABNORMAL LOW (ref 3.5–5.1)
Sodium: 134 mmol/L — ABNORMAL LOW (ref 135–145)

## 2022-06-22 MED ORDER — DOXYCYCLINE HYCLATE 100 MG PO TABS
100.0000 mg | ORAL_TABLET | Freq: Once | ORAL | Status: AC
  Administered 2022-06-22: 100 mg via ORAL
  Filled 2022-06-22: qty 1

## 2022-06-22 MED ORDER — LORAZEPAM 1 MG PO TABS
1.0000 mg | ORAL_TABLET | Freq: Once | ORAL | Status: AC
  Administered 2022-06-22: 1 mg via ORAL
  Filled 2022-06-22: qty 1

## 2022-06-22 MED ORDER — TRIAMCINOLONE ACETONIDE 0.1 % EX CREA: 1.0000 | TOPICAL_CREAM | Freq: Four times a day (QID) | CUTANEOUS | 0 refills | Status: DC

## 2022-06-22 MED ORDER — HYDROXYZINE PAMOATE 25 MG PO CAPS: 25.0000 mg | ORAL_CAPSULE | Freq: Three times a day (TID) | ORAL | 0 refills | Status: DC | PRN

## 2022-06-22 MED ORDER — DOXYCYCLINE HYCLATE 100 MG PO TABS: 100.0000 mg | ORAL_TABLET | Freq: Two times a day (BID) | ORAL | 0 refills | Status: AC

## 2022-06-22 MED ORDER — CEPHALEXIN 500 MG PO CAPS: 1000.0000 mg | ORAL_CAPSULE | Freq: Two times a day (BID) | ORAL | 0 refills | Status: DC

## 2022-06-22 MED ORDER — CEPHALEXIN 500 MG PO CAPS
500.0000 mg | ORAL_CAPSULE | Freq: Once | ORAL | Status: AC
  Administered 2022-06-22: 500 mg via ORAL
  Filled 2022-06-22: qty 1

## 2022-06-22 NOTE — ED Provider Notes (Signed)
[facility] Provider Note  Patient Contact: 4:40 PM (approximate)   History   Insect Bite   HPI  Zachary Ballard is a 27 y.o. male who presents the emergency department complaining of swelling, pain, erythema and pruritus to the spot on the left arm.  Patient states that he was out in the woods, got a tick on his left arm in this location.  Patient states that he was able to successfully completely remove the tick.  You remove the tick 2 days ago and pain and swelling started yesterday.  No fevers or chills.  Patient is concerned that he could have contracted Lyme's disease.  States that the tick had not been on his body for more than 24 hours and likely less than that.  No other systemic complaints of congestion, body aches, chest pain, shortness of breath, abdominal pain, nausea or vomiting.  No history of recurrent skin infections.  Patient does have a history of anxiety and states that the thought that he could have Lyme's disease has triggered a near panic attack for him.  He appears very nervous but has no suicidal or homicidal ideation.     Physical Exam   Triage Vital Signs: ED Triage Vitals  Enc Vitals Group     BP 06/22/22 1440 139/84     Pulse Rate 06/22/22 1440 (!) 109     Resp 06/22/22 1440 20     Temp 06/22/22 1442 98.3 F (36.8 C)     Temp src --      SpO2 06/22/22 1440 100 %     Weight 06/22/22 1441 185 lb (83.9 kg)     Height 06/22/22 1441  (1.727 m)     Head Circumference --      Peak Flow --      Pain Score 06/22/22 1441 5     Pain Loc --      Pain Edu? --      Excl. in [place]? --     Most recent vital signs: Vitals:   06/22/22 1440 06/22/22 1442  BP: 139/84   Pulse: (!) 109   Resp: 20   Temp:  98.3 F (36.8 C)  SpO2: 100%      General: Alert and in no acute distress.  Cardiovascular:  Good peripheral perfusion Respiratory: Normal respiratory effort without tachypnea or retractions. Lungs CTAB.  Musculoskeletal: Full  range of motion to all extremities.  Neurologic:  No gross focal neurologic deficits are appreciated.  Skin:   No rash noted.  Visualization of the left upper extremity reveals an erythematous edematous region along the left medial bicep.  There is central excoriation consistent with tick removal.  There is no current purulent drainage.  Tender to palpation but no fluctuance or induration.  Area measures approximately 6 cm in diameter.  This is not targetoid.  No other rash identified. Other:   ED Results / Procedures / Treatments   Labs (all labs ordered are listed, but only abnormal results are displayed) Labs Reviewed  CBC - Abnormal; Notable for the following components:      Result Value   RDW 11.0 (*)    All other components within normal limits  BASIC METABOLIC PANEL - Abnormal; Notable for the following components:   Sodium 134 (*)    Potassium 3.2 (*)    Glucose, Bld 135 (*)    All other components within normal limits     EKG     RADIOLOGY  No results found.  PROCEDURES:  Critical Care performed: No  Procedures   MEDICATIONS ORDERED IN ED: Medications  cephALEXin (KEFLEX) capsule 500 mg (has no administration in time range)  doxycycline (VIBRA-TABS) tablet 100 mg (has no administration in time range)  LORazepam (ATIVAN) tablet 1 mg (has no administration in time range)     IMPRESSION / MDM / ASSESSMENT AND PLAN / ED COURSE  I reviewed the triage vital signs and the nursing notes.                                 Differential diagnosis includes, but is not limited to, tick bite, Lyme disease, [place] spotted fever, cellulitis, abscess  Patient's presentation is most consistent with acute presentation with potential threat to life or bodily function.   Patient's diagnosis is consistent with tick bite, cellulitis, anxiety.  Patient presents emergency department after sustaining a tick bite to the left biceps region.  Patient was complaining of  of pain, erythema and edema to the site.  Tick was on less than 24 hours.  Visualization of the area revealed no targetoid/bull's-eye-like rash consistent with Lyme's disease.  Tick bite duration as well as time to symptoms is too short for there to be an transmission of Lyme's and developing symptoms.  Patient has findings consistent with cellulitis from the tick bite.  He also has a history of anxiety and is very anxious that he could have contracted Lyme's.  Patient is given the first dose of antibiotics here in the emergency department, Ativan for anxiety relief.  Patient has prescriptions for Keflex, Doxy, triamcinolone topically and hydroxyzine for itching.  Follow-up with primary care as needed.  Return precautions discussed with the patient..  Patient is given ED precautions to return to the ED for any worsening or new symptoms.     FINAL CLINICAL IMPRESSION(S) / ED DIAGNOSES   Final diagnoses:  Tick bite of left upper arm, initial encounter  Cellulitis of left upper extremity  Anxiety     Rx / DC Orders   ED Discharge Orders          Ordered    cephALEXin (KEFLEX) 500 MG capsule  2 times daily        06/22/22 1654    doxycycline (VIBRA-TABS) 100 MG tablet  2 times daily        06/22/22 1654    hydrOXYzine (VISTARIL) 25 MG capsule  3 times daily PRN        06/22/22 1654    triamcinolone cream (KENALOG) 0.1 %  4 times daily        06/22/22 1654             Note:  This document was prepared using Dragon voice recognition software and may include unintentional dictation errors.   Racheal Patches, PA-C 06/22/22 1656    Minna Antis, MD 06/22/22 [phone]

## 2022-06-22 NOTE — ED Triage Notes (Signed)
Pt to ED for tick bite to left upper arm a couple days ago, states pulled tick off. +nausea. States deep ache to left arm.  Red circle noted around bite area.

## 2022-07-11 ENCOUNTER — Emergency Department
Admission: EM | Admit: 2022-07-11 | Discharge: 2022-07-12 | Disposition: A | Discharge status: Home / Self Care | Payer: Medicaid Other | Attending: Emergency Medicine | Admitting: Emergency Medicine

## 2022-07-11 ENCOUNTER — Other Ambulatory Visit: Payer: Self-pay

## 2022-07-11 ENCOUNTER — Emergency Department: Payer: Medicaid Other

## 2022-07-11 DIAGNOSIS — F410 Panic disorder [episodic paroxysmal anxiety] without agoraphobia: Secondary | ICD-10-CM | POA: Insufficient documentation

## 2022-07-11 LAB — BASIC METABOLIC PANEL
Anion gap: 8 (ref 5–15)
BUN: 9 mg/dL (ref 6–20)
CO2: 28 mmol/L (ref 22–32)
Calcium: 9.1 mg/dL (ref 8.9–10.3)
Chloride: 100 mmol/L (ref 98–111)
Creatinine, Ser: 1.01 mg/dL (ref 0.61–1.24)
GFR, Estimated: >60 mL/min (ref >60)
Glucose, Bld: 101 mg/dL — ABNORMAL HIGH (ref 70–99)
Potassium: 3.4 mmol/L — ABNORMAL LOW (ref 3.5–5.1)
Sodium: 136 mmol/L (ref 135–145)

## 2022-07-11 LAB — TROPONIN I (HIGH SENSITIVITY): Troponin I (High Sensitivity): 2 ng/L (ref <18)

## 2022-07-11 LAB — CBC
HCT: 43.6 % (ref 39.0–52.0)
Hemoglobin: 15.3 g/dL (ref 13.0–17.0)
MCH: 29.9 pg (ref 26.0–34.0)
MCHC: 35.1 g/dL (ref 30.0–36.0)
MCV: 85.2 fL (ref 80.0–100.0)
Platelets: 311 10*3/uL (ref 150–400)
RBC: 5.12 MIL/uL (ref 4.22–5.81)
RDW: 11.5 % (ref 11.5–15.5)
WBC: 6.8 10*3/uL (ref 4.0–10.5)
nRBC: 0 % (ref 0.0–0.2)

## 2022-07-11 NOTE — ED Triage Notes (Addendum)
Pt to ED via POV c/o panic attack. Pt has hx of panic attacks, use to medication for anxiety but doesn't anymore. Referred to psychiatrist but hasn't gone to appt yet. Pt complaining of chest tightness and feels like his heart racing. Pt very anxious in triage. Denies SOB

## 2022-07-12 MED ORDER — DIAZEPAM 5 MG/ML IJ SOLN
5.0000 mg | Freq: Once | INTRAMUSCULAR | Status: DC
  Filled 2022-07-12: qty 2

## 2022-07-12 MED ORDER — LORAZEPAM 2 MG/ML IJ SOLN
1.0000 mg | Freq: Once | INTRAMUSCULAR | Status: DC
  Filled 2022-07-12: qty 1

## 2022-07-12 MED ORDER — DIAZEPAM 5 MG PO TABS
5.0000 mg | ORAL_TABLET | Freq: Once | ORAL | Status: AC
  Administered 2022-07-12: 5 mg via ORAL
  Filled 2022-07-12: qty 1

## 2022-07-12 MED ORDER — HYDROXYZINE PAMOATE 25 MG PO CAPS: 25.0000 mg | ORAL_CAPSULE | Freq: Three times a day (TID) | ORAL | 0 refills | Status: DC | PRN

## 2022-07-12 MED ORDER — LORAZEPAM 1 MG PO TABS: 1.0000 mg | ORAL_TABLET | Freq: Three times a day (TID) | ORAL | 0 refills | Status: DC | PRN

## 2022-07-12 NOTE — ED Provider Notes (Addendum)
[facility] Provider Note    Event Date/Time   First MD Initiated Contact with Patient 07/11/22 2343     (approximate)   History   Panic Attack and Chest Pain   HPI  Zachary Ballard is a 27 y.o. male   Past medical history of Zachary Ballard and panic attacks who presents to the emergency department with panic attack.  He states he has OCD and overthink various life stressors which leads to his panic attack.  He has been on medications in the past including benzodiazepines but has lost touch with his primary doctor and no longer takes any medications at all.  Denies drug or alcohol use.  Denies suicidality.  Wants medications for anxiety.  No other acute medical complaints.  External Medical Documents Reviewed: Discharge summary from July 2022 and February 2023 where is documented substance use opioid use      Physical Exam   Triage Vital Signs: ED Triage Vitals  Enc Vitals Group     BP 07/11/22 2047 (!) 134/93     Pulse Rate 07/11/22 2047 (!) 132     Resp 07/11/22 2047 (!) 24     Temp 07/11/22 2047 98 F (36.7 C)     Temp Source 07/11/22 2047 Oral     SpO2 07/11/22 2047 100 %     Weight 07/11/22 2055 190 lb (86.2 kg)     Height 07/11/22 2055 5\' 8"  (1.727 m)     Head Circumference --      Peak Flow --      Pain Score 07/11/22 2047 0     Pain Loc --      Pain Edu? --      Excl. in [place]? --     Most recent vital signs: Vitals:   07/12/22 0015 07/12/22 0115  BP:    Pulse:    Resp: (!) 21   Temp:  98 F (36.7 C)  SpO2:      General: Awake, no distress.  CV:  Good peripheral perfusion.  Resp:  Normal effort.  Abd:  No distention.  Other:  Awake alert anxious appearing tachycardic low 100s otherwise vital signs within normal limits.  No signs of acute withdrawal.   ED Results / Procedures / Treatments   Labs (all labs ordered are listed, but only abnormal results are displayed) Labs Reviewed  BASIC METABOLIC PANEL - Abnormal; Notable for  the following components:      Result Value   Potassium 3.4 (*)    Glucose, Bld 101 (*)    All other components within normal limits  CBC  TROPONIN I (HIGH SENSITIVITY)     I ordered and reviewed the above labs they are notable for troponin is negative  EKG  ED ECG REPORT I, Pilar Jarvis, the attending physician, personally viewed and interpreted this ECG.   Date: 07/12/2022  EKG Time: 2049  Rate: 126  Rhythm: sinus tachycardia  Axis: nl  Intervals:none  ST&T Change: no stemi    RADIOLOGY I independently reviewed and interpreted chest x-ray and see no obvious focality or pneumothorax   PROCEDURES:  Critical Care performed: No  Procedures   MEDICATIONS ORDERED IN ED: Medications  diazepam (VALIUM) injection 5 mg (has no administration in time range)  diazepam (VALIUM) tablet 5 mg (5 mg Oral Given 07/12/22 0053)   IMPRESSION / MDM / ASSESSMENT AND PLAN / ED COURSE  I reviewed the triage vital signs and the nursing notes.  Patient's presentation is most consistent with acute presentation with potential threat to life or bodily function.  Differential diagnosis includes, but is not limited to, manic tach/anxiety, withdrawal, dysrhythmia, electrolyte disturbance, intoxication   The patient is on the cardiac monitor to evaluate for evidence of arrhythmia and/or significant heart rate changes.  MDM: Patient with panic attack and history of the same denies substance use does not look like he is acutely withdrawing, tachycardia likely in the setting of anxiety/panic attacks, denies suicidality.  I will give him Valium for anxiety and prescribe him his previously prescribed ativan po as needed, limited to 8 pills to use as needed for anxiety and also refer him to PMD for ongoing management of his anxiety as there are likely better long term medications for his anxiety.   He denies chest pain.  His EKG is nonischemic and his troponin is  negative.  I think his symptoms are all in the setting of panic attack.        FINAL CLINICAL IMPRESSION(S) / ED DIAGNOSES   Final diagnoses:  Panic attacks     Rx / DC Orders   ED Discharge Orders          Ordered    hydrOXYzine (VISTARIL) 25 MG capsule  3 times daily PRN        07/12/22 0115    Ambulatory Referral to Primary Care (Establish Care)        07/12/22 0115    LORazepam (ATIVAN) 1 MG tablet  Every 8 hours PRN        07/12/22 0121             Note:  This document was prepared using Dragon voice recognition software and may include unintentional dictation errors.    Pilar Jarvis, MD 07/12/22 [id number]    Pilar Jarvis, MD 07/12/22 [phone]

## 2022-07-17 ENCOUNTER — Encounter: Payer: Self-pay | Admitting: Family Medicine

## 2022-07-17 ENCOUNTER — Ambulatory Visit (INDEPENDENT_AMBULATORY_CARE_PROVIDER_SITE_OTHER): Payer: Medicaid Other | Admitting: Family Medicine

## 2022-07-17 VITALS — BP 115/77 | HR 99 | Temp 97.9°F | Resp 16 | Ht 69.0 in | Wt 209.0 lb

## 2022-07-17 DIAGNOSIS — F410 Panic disorder [episodic paroxysmal anxiety] without agoraphobia: Secondary | ICD-10-CM | POA: Diagnosis not present

## 2022-07-17 DIAGNOSIS — F411 Generalized anxiety disorder: Secondary | ICD-10-CM | POA: Diagnosis not present

## 2022-07-17 MED ORDER — ALPRAZOLAM 1 MG PO TABS: 1.0000 mg | ORAL_TABLET | Freq: Two times a day (BID) | ORAL | 0 refills | Status: AC | PRN

## 2022-07-17 MED ORDER — ESCITALOPRAM OXALATE 10 MG PO TABS: 10.0000 mg | ORAL_TABLET | Freq: Every day | ORAL | 0 refills | Status: DC

## 2022-07-17 NOTE — Assessment & Plan Note (Addendum)
Patient exhibited persistent anxiety throughout the interview.  Discussed with patient that, given his reversion to severe anxiety with frequent panic attacks (multiple times per day) when off medication, it may not be possible for him to stay off medication long-term.  Discussed that an every day medication is likely to reduce the frequency of his panic attacks and enable him to regain better control in his life.  Discussed that I would be prescribing escitalopram which has a generally good side effect profile, as well as a short course of alprazolam to help him mitigate his anxiety attacks when they occur prior to the escitalopram reaching a maintenance level.  Discussed the importance of minimizing use of the alprazolam and trying to manage panic attacks as they start by focusing on alternative things.  Examples I gave include turning on music that he strongly enjoys to help distract him, as well as progressive muscle relaxation, which I explained to him as tensing muscles and relaxing them, starting from the feet and moving up until he has done this with his whole body.  Also provided handouts in the AVS to give him further information on anxiety and panic attacks and potential methods to mitigate his panic attacks.  Will plan for follow-up in 2 to 4 weeks, based on patient's needs.  Will consider increasing the escitalopram in 1 week if he does not start to experience some improvement, as this is the minimum recommended timeframe for dosage increase.

## 2022-07-17 NOTE — Progress Notes (Signed)
I,Vanessa  Vital,acting as a Neurosurgeon for [employer], DO.,have documented all relevant documentation on the behalf of [employer], DO,as directed by  [employer], DO while in the presence of Maryse Brierley N Thedford Bunton, DO.   New patient visit   Patient: Zachary Ballard   DOB: 1995-08-22   27 y.o. Male  MRN: [id number] Visit Date: 07/17/2022  Today's healthcare provider: Sherlyn Hay, DO   Chief Complaint  Patient presents with   New Patient (Initial Visit)   Subjective    Zachary Ballard is a 27 y.o. male who presents today as a new patient to establish care.  HPI HPI   Patient would like to discuss medication for anxiety. States has been to the ER a couple times in the past 3 week. Reports to get dizzy, light headedness when panic attacks occur. Last edited by Lubertha Basque, CMA on 07/17/2022  1:23 PM.     Patient reports would like to discuss medications for anxiety.  Patient reports that he has been dealing with anxiety and panic attacks his whole life.  Previously, he was put on various medications through the years but decided to take himself off of those medications years ago when he lived in [place].  He does not remember which medications he was on except for hydroxyzine and alprazolam.  He states that he does not want to have to take medications through his whole life.  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  In particular, when he tries to focus on his breathing in order to calm himself, he becomes overfocused and starts to panic.  He has had many episodes of hyperventilating and passing out in relation to this.    He recently quit his job voluntarily so that his work would not have to deal with his panic attacks, which have been becoming worse.  He has gone to the ER 3 times over the past month for panic attacks, including most recently a week  ago at which point he had had an episode of hyperventilating and passing out.  He notes that he is also unable to ride in a car for even 10 minutes, especially if he is by himself; he states that he gets "stuck in his head" when he has nothing to distract himself.  He emphasizes that he does not want to have to take medications for the rest of his life but also expresses that he would like to "get his life back."  At his most recent ER visit, he was prescribed a short course of Ativan, but he states he is not willing to take it because it makes him nauseous in a way that is different from the nausea he experiences when he is panicking.  This nausea seems to him to be something that can't be resolved by vomiting.  Of note, patient had previously had 7 surgeries over the course of 6 years and was prescribed a large quantity of opiates in that time.  He states he was placed on methadone as a precaution to wean him off of the opiates given how long he was on them. He also reports that, years ago, he had multiple episodes of significant stomach discomfort and vomiting in relation to taking NSAIDs, which resolved when he stopped taking the NSAIDs. So, at this time, he avoids NSAIDs as he seems to revert  to the same experience when he has tried to take them again.   He reports he got all childhood vaccinations; he refuses COVID and flu vaccines due to anxiety.    Past Medical History:  Diagnosis Date   Family history of adverse reaction to anesthesia    mom itches/ nausea/hard to get to sleep   Panic attacks    Patellar instability of left knee    s/p multiple surgeries by Dr. Ivin Poot at [facility]   Past Surgical History:  Procedure Laterality Date   DENTAL SURGERY     KNEE ARTHROSCOPY Left    medial patellofemoral ligament repair   KNEE SURGERY  2018   Dr. Ivin Poot at [facility], hx of multiple surgeries   WISDOM TOOTH EXTRACTION     Family Status  Relation Name Status   Mother  Alive   Father  Alive   Mat  Aunt  (Not Specified)   Family History  Problem Relation Age of Onset   Epilepsy Mother    Other Father        unknown medical history   Panic disorder Maternal Aunt    Social History   Socioeconomic History   Marital status: Single    Spouse name: Not on file   Number of children: Not on file   Years of education: Not on file   Highest education level: Not on file  Occupational History   Not on file  Tobacco Use   Smoking status: Former   Smokeless tobacco: Former    Types: Associate Professor Use: Former  Substance and Sexual Activity   Alcohol use: Not Currently   Drug use: Not Currently    Types: Marijuana    Comment: used in high school once   Sexual activity: Not Currently  Other Topics Concern   Not on file  Social History Narrative   Not on file   Social Determinants of Health   Financial Resource Strain: Not on file  Food Insecurity: Not on file  Transportation Needs: Not on file  Physical Activity: Not on file  Stress: Not on file  Social Connections: Not on file   Outpatient Medications Prior to Visit  Medication Sig   acetaminophen (TYLENOL) 500 MG tablet Take 500 mg by mouth every 6 (six) hours as needed for mild pain or moderate pain.   [DISCONTINUED] amoxicillin-clavulanate (AUGMENTIN) 875-125 MG tablet Take 1 tablet by mouth 2 (two) times daily. (Patient not taking: Reported on 05/27/2022)   [DISCONTINUED] cephALEXin (KEFLEX) 500 MG capsule Take 2 capsules (1,000 mg total) by mouth 2 (two) times daily. (Patient not taking: Reported on 07/17/2022)   [DISCONTINUED] hydrOXYzine (VISTARIL) 25 MG capsule Take 1 capsule (25 mg total) by mouth 3 (three) times daily as needed for itching. (Patient not taking: Reported on 07/17/2022)   [DISCONTINUED] LORazepam (ATIVAN) 1 MG tablet Take 1 tablet (1 mg total) by mouth every 8 (eight) hours as needed for anxiety. (Patient not taking: Reported on 07/17/2022)   [DISCONTINUED] methadone (DOLOPHINE) 10 MG/5ML  solution Take 20 mg by mouth every 6 (six) hours as needed for pain. (Patient not taking: Reported on 07/17/2022)   [DISCONTINUED] triamcinolone cream (KENALOG) 0.1 % Apply 1 Application topically 4 (four) times daily. (Patient not taking: Reported on 07/17/2022)   No facility-administered medications prior to visit.   Allergies  Allergen Reactions   Nsaids Other (See Comments)    Severe stomach upset   Codeine Rash     There is no  immunization history on file for this patient.  Health Maintenance  Topic Date Due   COVID-19 Vaccine (1) Never done   HPV VACCINES (1 - Male 2-dose series) Never done   DTaP/Tdap/Td (1 - Tdap) Never done   INFLUENZA VACCINE  10/10/2022   Hepatitis C Screening  Completed   HIV Screening  Completed    Patient Care Team: Niambi Smoak, Monico Blitz, DO as PCP - General (Family Medicine)  Review of Systems  Constitutional:  Positive for appetite change, diaphoresis and fatigue.  Respiratory:  Positive for chest tightness and shortness of breath.   Gastrointestinal:  Positive for nausea.  Neurological:  Positive for dizziness, tremors, syncope, light-headedness, numbness and headaches.  Psychiatric/Behavioral:  Positive for sleep disturbance. The patient is nervous/anxious.   All other systems reviewed and are negative.      Objective    BP 115/77 (BP Location: Right Arm, Patient Position: Sitting, Cuff Size: Large)   Pulse 99   Temp 97.9 F (36.6 C) (Oral)   Resp 16   Ht 5\' 9"  (1.753 m)   Wt 209 lb (94.8 kg)   SpO2 98%   BMI 30.86 kg/m    Physical Exam Vitals reviewed.  Constitutional:      General: He is not in acute distress.    Appearance: Normal appearance. He is not diaphoretic.  HENT:     Head: Normocephalic and atraumatic.  Eyes:     General: No scleral icterus.    Conjunctiva/sclera: Conjunctivae normal.  Cardiovascular:     Rate and Rhythm: Regular rhythm. Tachycardia present.     Pulses: Normal pulses.     Heart sounds: Normal heart  sounds. No murmur heard. Pulmonary:     Effort: Pulmonary effort is normal. No respiratory distress.     Breath sounds: Normal breath sounds. No wheezing or rhonchi.  Abdominal:     General: Bowel sounds are normal.  Musculoskeletal:     Cervical back: Neck supple.     Right lower leg: No edema.     Left lower leg: No edema.  Lymphadenopathy:     Cervical: No cervical adenopathy.  Skin:    General: Skin is warm and dry.     Findings: No rash.  Neurological:     Mental Status: He is alert and oriented to person, place, and time. Mental status is at baseline.  Psychiatric:        Attention and Perception: Attention normal. He does not perceive auditory or visual hallucinations.        Mood and Affect: Mood is anxious.        Speech: Speech normal.        Behavior: Behavior normal. Behavior is cooperative.        Thought Content: Thought content is not paranoid or delusional. Thought content does not include homicidal or suicidal ideation. Thought content does not include homicidal or suicidal plan.     Comments: Persistently active - shaking legs and/or moving arms throughout visit. Very anxious.     Depression Screen    07/17/2022    1:31 PM  PHQ 2/9 Scores  PHQ - 2 Score 3  PHQ- 9 Score 12   No results found for any visits on 07/17/22.  Assessment & Plan      Problem List Items Addressed This Visit     Generalized anxiety disorder with panic attacks - Primary    Patient exhibited persistent anxiety throughout the interview.  Discussed with patient that, given his reversion to  severe anxiety with frequent panic attacks (multiple times per day) when off medication, it may not be possible for him to stay off medication long-term.  Discussed that an every day medication is likely to reduce the frequency of his panic attacks and enable him to regain better control in his life.  Discussed that I would be prescribing escitalopram which has a generally good side effect profile, as  well as a short course of alprazolam to help him mitigate his anxiety attacks when they occur prior to the escitalopram reaching a maintenance level.  Discussed the importance of minimizing use of the alprazolam and trying to manage panic attacks as they start by focusing on alternative things.  Examples I gave include turning on music that he strongly enjoys to help distract him, as well as progressive muscle relaxation, which I explained to him as tensing muscles and relaxing them, starting from the feet and moving up until he has done this with his whole body.  Also provided handouts in the AVS to give him further information on anxiety and panic attacks and potential methods to mitigate his panic attacks.  Will plan for follow-up in 2 to 4 weeks, based on patient's needs.  Will consider increasing the escitalopram in 1 week if he does not start to experience some improvement, as this is the minimum recommended timeframe for dosage increase.      Relevant Medications   escitalopram (LEXAPRO) 10 MG tablet   ALPRAZolam (XANAX) 1 MG tablet     Return in about 4 weeks (around 08/14/2022) for anxiety f/u; CPE.     The entirety of the information documented in the History of Present Illness, Review of Systems and Physical Exam were personally obtained by me. Portions of this information were initially documented by the CMA, Erie Noe Vital, and reviewed by me for thoroughness and accuracy.     Sherlyn Hay, DO  [facility] [phone] (phone) [phone] (fax)  [facility]

## 2022-11-10 ENCOUNTER — Other Ambulatory Visit: Payer: Self-pay

## 2022-11-10 ENCOUNTER — Emergency Department: Payer: Medicaid Other

## 2022-11-10 ENCOUNTER — Inpatient Hospital Stay
Admission: EM | Admit: 2022-11-10 | Discharge: 2022-11-11 | DRG: 918 | Disposition: A | Discharge status: Home / Self Care | Payer: Medicaid Other | Attending: Hospitalist | Admitting: Hospitalist

## 2022-11-10 DIAGNOSIS — Z885 Allergy status to narcotic agent status: Secondary | ICD-10-CM | POA: Diagnosis not present

## 2022-11-10 DIAGNOSIS — Z79899 Other long term (current) drug therapy: Secondary | ICD-10-CM

## 2022-11-10 DIAGNOSIS — F1910 Other psychoactive substance abuse, uncomplicated: Secondary | ICD-10-CM | POA: Diagnosis not present

## 2022-11-10 DIAGNOSIS — T40411A Poisoning by fentanyl or fentanyl analogs, accidental (unintentional), initial encounter: Principal | ICD-10-CM

## 2022-11-10 DIAGNOSIS — F17220 Nicotine dependence, chewing tobacco, uncomplicated: Secondary | ICD-10-CM | POA: Diagnosis present

## 2022-11-10 DIAGNOSIS — T50904A Poisoning by unspecified drugs, medicaments and biological substances, undetermined, initial encounter: Secondary | ICD-10-CM

## 2022-11-10 DIAGNOSIS — R4182 Altered mental status, unspecified: Secondary | ICD-10-CM | POA: Diagnosis present

## 2022-11-10 DIAGNOSIS — F419 Anxiety disorder, unspecified: Secondary | ICD-10-CM | POA: Diagnosis present

## 2022-11-10 DIAGNOSIS — R0902 Hypoxemia: Secondary | ICD-10-CM | POA: Diagnosis present

## 2022-11-10 DIAGNOSIS — Z820 Family history of epilepsy and other diseases of the nervous system: Secondary | ICD-10-CM

## 2022-11-10 DIAGNOSIS — D72828 Other elevated white blood cell count: Secondary | ICD-10-CM | POA: Diagnosis present

## 2022-11-10 DIAGNOSIS — Z886 Allergy status to analgesic agent status: Secondary | ICD-10-CM | POA: Diagnosis not present

## 2022-11-10 DIAGNOSIS — F411 Generalized anxiety disorder: Secondary | ICD-10-CM | POA: Diagnosis present

## 2022-11-10 DIAGNOSIS — R042 Hemoptysis: Secondary | ICD-10-CM | POA: Diagnosis present

## 2022-11-10 DIAGNOSIS — N179 Acute kidney failure, unspecified: Secondary | ICD-10-CM | POA: Diagnosis present

## 2022-11-10 DIAGNOSIS — J690 Pneumonitis due to inhalation of food and vomit: Secondary | ICD-10-CM | POA: Insufficient documentation

## 2022-11-10 DIAGNOSIS — T50901A Poisoning by unspecified drugs, medicaments and biological substances, accidental (unintentional), initial encounter: Secondary | ICD-10-CM | POA: Diagnosis present

## 2022-11-10 DIAGNOSIS — J811 Chronic pulmonary edema: Secondary | ICD-10-CM

## 2022-11-10 LAB — CBC
HCT: 48.5 % (ref 39.0–52.0)
Hemoglobin: 16.6 g/dL (ref 13.0–17.0)
MCH: 30.2 pg (ref 26.0–34.0)
MCHC: 34.2 g/dL (ref 30.0–36.0)
MCV: 88.2 fL (ref 80.0–100.0)
Platelets: 322 10*3/uL (ref 150–400)
RBC: 5.5 MIL/uL (ref 4.22–5.81)
RDW: 11.5 % (ref 11.5–15.5)
WBC: 19.1 10*3/uL — ABNORMAL HIGH (ref 4.0–10.5)
nRBC: 0 % (ref 0.0–0.2)

## 2022-11-10 LAB — COMPREHENSIVE METABOLIC PANEL
ALT: 88 U/L — ABNORMAL HIGH (ref 0–44)
AST: 44 U/L — ABNORMAL HIGH (ref 15–41)
Albumin: 4.2 g/dL (ref 3.5–5.0)
Alkaline Phosphatase: 117 U/L (ref 38–126)
Anion gap: 11 (ref 5–15)
BUN: 14 mg/dL (ref 6–20)
CO2: 27 mmol/L (ref 22–32)
Calcium: 8.9 mg/dL (ref 8.9–10.3)
Chloride: 98 mmol/L (ref 98–111)
Creatinine, Ser: 1.33 mg/dL — ABNORMAL HIGH (ref 0.61–1.24)
GFR, Estimated: >60 mL/min (ref >60)
Glucose, Bld: 175 mg/dL — ABNORMAL HIGH (ref 70–99)
Potassium: 3.9 mmol/L (ref 3.5–5.1)
Sodium: 136 mmol/L (ref 135–145)
Total Bilirubin: 0.7 mg/dL (ref 0.3–1.2)
Total Protein: 7.9 g/dL (ref 6.5–8.1)

## 2022-11-10 LAB — URINE DRUG SCREEN, QUALITATIVE (ARMC ONLY)
Amphetamines, Ur Screen: NOT DETECTED
Barbiturates, Ur Screen: NOT DETECTED
Benzodiazepine, Ur Scrn: NOT DETECTED
Cannabinoid 50 Ng, Ur ~~LOC~~: NOT DETECTED
Cocaine Metabolite,Ur ~~LOC~~: NOT DETECTED
MDMA (Ecstasy)Ur Screen: NOT DETECTED
Methadone Scn, Ur: NOT DETECTED
Opiate, Ur Screen: NOT DETECTED
Phencyclidine (PCP) Ur S: NOT DETECTED
Tricyclic, Ur Screen: NOT DETECTED

## 2022-11-10 LAB — ETHANOL: Alcohol, Ethyl (B): <10 mg/dL (ref <10)

## 2022-11-10 LAB — DIFFERENTIAL
Abs Immature Granulocytes: 0.13 10*3/uL — ABNORMAL HIGH (ref 0.00–0.07)
Basophils Absolute: 0.1 10*3/uL (ref 0.0–0.1)
Basophils Relative: 0 %
Eosinophils Absolute: 0.1 10*3/uL (ref 0.0–0.5)
Eosinophils Relative: 1 %
Immature Granulocytes: 1 %
Lymphocytes Relative: 11 %
Lymphs Abs: 2.1 10*3/uL (ref 0.7–4.0)
Monocytes Absolute: 0.9 10*3/uL (ref 0.1–1.0)
Monocytes Relative: 5 %
Neutro Abs: 15.9 10*3/uL — ABNORMAL HIGH (ref 1.7–7.7)
Neutrophils Relative %: 82 %

## 2022-11-10 LAB — SALICYLATE LEVEL: Salicylate Lvl: <7 mg/dL — ABNORMAL LOW (ref 7.0–30.0)

## 2022-11-10 LAB — ACETAMINOPHEN LEVEL: Acetaminophen (Tylenol), Serum: <10 ug/mL — ABNORMAL LOW (ref 10–30)

## 2022-11-10 MED ORDER — ACETAMINOPHEN 650 MG RE SUPP: 650.0000 mg | Freq: Four times a day (QID) | RECTAL | Status: DC | PRN

## 2022-11-10 MED ORDER — ONDANSETRON HCL 4 MG/2ML IJ SOLN
4.0000 mg | Freq: Four times a day (QID) | INTRAMUSCULAR | Status: DC | PRN
  Administered 2022-11-11: 4 mg via INTRAVENOUS
  Filled 2022-11-10: qty 2

## 2022-11-10 MED ORDER — SODIUM CHLORIDE 0.9 % IV BOLUS
1000.0000 mL | Freq: Once | INTRAVENOUS | Status: AC
  Administered 2022-11-10: 1000 mL via INTRAVENOUS

## 2022-11-10 MED ORDER — SODIUM CHLORIDE 0.9 % IV SOLN
3.0000 g | Freq: Four times a day (QID) | INTRAVENOUS | Status: DC
  Administered 2022-11-10 – 2022-11-11 (×5): 3 g via INTRAVENOUS
  Filled 2022-11-10 (×6): qty 8

## 2022-11-10 MED ORDER — LORAZEPAM 0.5 MG PO TABS
0.5000 mg | ORAL_TABLET | ORAL | Status: DC | PRN
  Administered 2022-11-10 – 2022-11-11 (×5): 0.5 mg via ORAL
  Filled 2022-11-10 (×6): qty 1

## 2022-11-10 MED ORDER — ONDANSETRON HCL 4 MG PO TABS: 4.0000 mg | ORAL_TABLET | Freq: Four times a day (QID) | ORAL | Status: DC | PRN

## 2022-11-10 MED ORDER — IOHEXOL 300 MG/ML  SOLN
75.0000 mL | Freq: Once | INTRAMUSCULAR | Status: AC | PRN
  Administered 2022-11-10: 75 mL via INTRAVENOUS

## 2022-11-10 MED ORDER — LORAZEPAM 2 MG/ML IJ SOLN
0.5000 mg | INTRAMUSCULAR | Status: DC | PRN
  Administered 2022-11-10: 0.5 mg via INTRAVENOUS
  Filled 2022-11-10: qty 1

## 2022-11-10 MED ORDER — ACETAMINOPHEN 325 MG PO TABS: 650.0000 mg | ORAL_TABLET | Freq: Four times a day (QID) | ORAL | Status: DC | PRN

## 2022-11-10 MED ORDER — SODIUM CHLORIDE 0.9 % IV SOLN
3.0000 g | Freq: Once | INTRAVENOUS | Status: AC
  Administered 2022-11-10: 3 g via INTRAVENOUS
  Filled 2022-11-10: qty 8

## 2022-11-10 MED ORDER — ENOXAPARIN SODIUM 40 MG/0.4ML IJ SOSY: 40.0000 mg | PREFILLED_SYRINGE | INTRAMUSCULAR | Status: DC

## 2022-11-10 MED ORDER — ONDANSETRON HCL 4 MG/2ML IJ SOLN
4.0000 mg | Freq: Once | INTRAMUSCULAR | Status: AC
  Administered 2022-11-10: 4 mg via INTRAVENOUS
  Filled 2022-11-10: qty 2

## 2022-11-10 NOTE — ED Notes (Signed)
Patient stating feeling anxious. PRN meds given.

## 2022-11-10 NOTE — ED Triage Notes (Addendum)
Pt to ed from home via [facility] for drug overdose. Pt will not tell staff what he took. EMS admin narcan by FD 2 mg intranasal. EMS gave 2 mg of IV narcan.  20GRAC 89% on RA 95% on 4 liters  Pt is alert. EMS advised he may have aspirated. Pt complaint of hard time taking a deep breath. 142/88 ST 108 BGL 244  Pt states "I think it might have been fentanyl but I'm not sure. I think I found some old fentanyl. I have been clean for 7 months".

## 2022-11-10 NOTE — ED Notes (Signed)
Patient feeling very anxious and requesting something for anxiety. Admitting sent message.

## 2022-11-10 NOTE — H&P (Addendum)
History and Physical    Patient: Zachary Ballard:[id number] DOB: March 16, 1995 DOA: 11/10/2022 DOS: the patient was seen and examined on 11/10/2022 PCP: Sherlyn Hay, DO  Patient coming from: Home  Chief Complaint:  Chief Complaint  Patient presents with   Drug Overdose   HPI: Zachary Ballard is a 27 y.o. male with medical history significant of polysubstance abuse presenting with drug overdose and aspiration pneumonia.  Patient reports recently coming out of a [place] drug abstinence program.  Patient states has been clean for roughly 1 year with noted prior polysubstance abuse in the past.  Patient states he took an unknown pill when he got home.  Patient subsequently blacked out.  Per report, patient was found unconscious.  EMS subsequently called.  Patient given intranasal Narcan.  Given a total of 2 mg of IV Narcan as well.  Patient reports some shortness of breath as well as some mild pain with deep breathing.  Now improved.  Denies any abdominal pain.  Hemiparesis confusion.  Fevers or chills.  Was otherwise in normal state of health prior to ingestion.  White count 19, hemoglobin 16.6, platelets 322, creatinine 1.33, glucose 175.  AST 44, ALT 88. Presented to the ER afebrile, hemodynamically stable.  Initially required 2 L of nasal cannula.  Now satting greater than 97% on room air.  CT chest with confluence and symmetrical bilateral perihilar groundglass changes.?  Noncardiogenic pulmonary edema versus aspiration or infection being less likely. Review of Systems: As mentioned in the history of present illness. All other systems reviewed and are negative. Past Medical History:  Diagnosis Date   Family history of adverse reaction to anesthesia    mom itches/ nausea/hard to get to sleep   Panic attacks    Patellar instability of left knee    s/p multiple surgeries by Dr. Ivin Poot at [facility]   Past Surgical History:  Procedure Laterality Date   DENTAL SURGERY     KNEE ARTHROSCOPY Left     medial patellofemoral ligament repair   KNEE SURGERY  2018   Dr. Ivin Poot at [facility], hx of multiple surgeries   WISDOM TOOTH EXTRACTION     Social History:  reports that he has quit smoking. He has quit using smokeless tobacco.  His smokeless tobacco use included chew. He reports that he does not currently use alcohol. He reports that he does not currently use drugs after having used the following drugs: Marijuana.  Allergies  Allergen Reactions   Nsaids Other (See Comments)    Severe stomach upset   Codeine Rash    Family History  Problem Relation Age of Onset   Epilepsy Mother    Other Father        unknown medical history   Panic disorder Maternal Aunt     Prior to Admission medications   Medication Sig Start Date End Date Taking? Authorizing Provider  acetaminophen (TYLENOL) 500 MG tablet Take 500 mg by mouth every 6 (six) hours as needed for mild pain or moderate pain.    [provider]  escitalopram (LEXAPRO) 10 MG tablet Take 1 tablet (10 mg total) by mouth daily. 07/17/22 08/16/22  Sherlyn Hay, DO    Physical Exam: Vitals:   11/10/22 0700 11/10/22 0725 11/10/22 0800 11/10/22 0830  BP: 111/64  105/61 106/63  Pulse: 87  83 77  Resp: (!) 21  (!) 25 14  Temp:  97.9 F (36.6 C)    TempSrc:  Oral    SpO2: 98%  97% 99%  Weight:      Height:       Physical Exam Constitutional:      Appearance: He is normal weight.  HENT:     Head: Normocephalic and atraumatic.     Nose: Nose normal.     Mouth/Throat:     Mouth: Mucous membranes are moist.  Cardiovascular:     Rate and Rhythm: Normal rate and regular rhythm.  Pulmonary:     Effort: Pulmonary effort is normal.     Breath sounds: Rales present.  Abdominal:     General: Bowel sounds are normal.  Musculoskeletal:     Cervical back: Normal range of motion.  Skin:    General: Skin is warm.  Neurological:     General: No focal deficit present.  Psychiatric:        Mood and Affect: Mood normal.     Data  Reviewed:  There are no new results to review at this time.  CT Chest W Contrast CLINICAL DATA:  27 year old male with hemoptysis.  Drug overdose.  EXAM: CT CHEST WITH CONTRAST  TECHNIQUE: Multidetector CT imaging of the chest was performed during intravenous contrast administration.  RADIATION DOSE REDUCTION: This exam was performed according to the departmental dose-optimization program which includes automated exposure control, adjustment of the mA and/or kV according to patient size and/or use of iterative reconstruction technique.  CONTRAST:  75mL OMNIPAQUE IOHEXOL 300 MG/ML  SOLN  COMPARISON:  Chest radiographs 07/11/2022. Prior chest CTA 09/27/2020.  FINDINGS: Cardiovascular: Heart size is at the upper limits of normal. No pericardial effusion. Major mediastinal vascular structures appear unremarkable.  Mediastinum/Nodes: Negative. No mediastinal mass or lymphadenopathy.  Lungs/Pleura: Major airways are patent with relatively normal lung volumes. Confluent bilateral perihilar peribronchial ground-glass opacity which is fairly symmetric (series 3, image 51). No superimposed septal thickening. No pleural effusion or consolidation. Mild superimposed lower lobe dependent atelectasis. No pneumothorax.  Upper Abdomen: Negative visible liver, gallbladder, spleen (splenule normal variant), pancreas, adrenal glands, kidneys, and bowel in the upper abdomen. No upper abdominal free air or free fluid.  Musculoskeletal: Age advanced although chronic and stable mid and lower thoracic vertebral endplate degeneration, irregularity. No acute osseous abnormality identified.  IMPRESSION: 1. Confluent and symmetric bilateral perihilar ground-glass opacity. In this setting favor noncardiogenic pulmonary edema, with aspiration or infection felt less likely. No pleural effusion or consolidation. 2. Otherwise negative CT appearance of the Chest.  Electronically Signed   By: Odessa Fleming M.D.   On: 11/10/2022 05:24 CT Head Wo Contrast CLINICAL DATA:  27 year old male with altered mental status. Drug overdose.  EXAM: CT HEAD WITHOUT CONTRAST  TECHNIQUE: Contiguous axial images were obtained from the base of the skull through the vertex without intravenous contrast.  RADIATION DOSE REDUCTION: This exam was performed according to the departmental dose-optimization program which includes automated exposure control, adjustment of the mA and/or kV according to patient size and/or use of iterative reconstruction technique.  COMPARISON:  None Available.  FINDINGS: Brain: No midline shift, ventriculomegaly, mass effect, evidence of mass lesion, intracranial hemorrhage or evidence of cortically based acute infarction. Gray-white matter differentiation is within normal limits throughout the brain.  Vascular: No suspicious intracranial vascular hyperdensity.  Skull: Intact.  Sinuses/Orbits: Visualized paranasal sinuses and mastoids are well aerated.  Other: Visualized orbits and scalp soft tissues are within normal limits.  IMPRESSION: Normal noncontrast Head CT.  Electronically Signed   By: Odessa Fleming M.D.   On: 11/10/2022 04:15  Lab Results  Component Value Date   WBC 19.1 (H) 11/10/2022   HGB 16.6 11/10/2022   HCT 48.5 11/10/2022   MCV 88.2 11/10/2022   PLT 322 11/10/2022   Last metabolic panel Lab Results  Component Value Date   GLUCOSE 175 (H) 11/10/2022   NA 136 11/10/2022   K 3.9 11/10/2022   CL 98 11/10/2022   CO2 27 11/10/2022   BUN 14 11/10/2022   CREATININE 1.33 (H) 11/10/2022   GFRNONAA >60 11/10/2022   CALCIUM 8.9 11/10/2022   PHOS 1.2 (L) 09/28/2020   PROT 7.9 11/10/2022   ALBUMIN 4.2 11/10/2022   BILITOT 0.7 11/10/2022   ALKPHOS 117 11/10/2022   AST 44 (H) 11/10/2022   ALT 88 (H) 11/10/2022   ANIONGAP 11 11/10/2022    Assessment and Plan: * Overdose Patient reports ingestion of unknown narcotic with subsequent loss of  consciousness and respiratory failure Noted baseline history of polysubstance abuse in the past as patient reports abstinence times around 1 year Status post intranasal Narcan by EMS UDS pending As needed IV Ativan for agitation and withdrawals Monitor   Aspiration pneumonia (HCC) Aspiration event associated with drug overdose CT of the chest with bilateral perihilar ground-glass changes  IV Unasyn Supplemental O2 as needed Monitor    Greater than 50% was spent in counseling and coordination of care with patient Total encounter time 80 minutes or more     Advance Care Planning:   Code Status: Prior   Consults: None   Family Communication: No family at the bedside  Severity of Illness: The appropriate patient status for this patient is OBSERVATION. Observation status is judged to be reasonable and necessary in order to provide the required intensity of service to ensure the patient's safety. The patient's presenting symptoms, physical exam findings, and initial radiographic and laboratory data in the context of their medical condition is felt to place them at decreased risk for further clinical deterioration. Furthermore, it is anticipated that the patient will be medically stable for discharge from the hospital within 2 midnights of admission.   Author: Floydene Flock, MD 11/10/2022 8:54 AM  For on call review [email or web address].

## 2022-11-10 NOTE — TOC CM/SW Note (Signed)
CSW added SA resources to AVS.

## 2022-11-10 NOTE — ED Notes (Signed)
Patient provided box meal and ginger ale.

## 2022-11-10 NOTE — Assessment & Plan Note (Signed)
Patient reports ingestion of unknown narcotic with subsequent loss of consciousness and respiratory failure Noted baseline history of polysubstance abuse in the past as patient reports abstinence times around 1 year Status post intranasal Narcan by EMS UDS pending As needed IV Ativan for agitation and withdrawals Monitor

## 2022-11-10 NOTE — ED Notes (Signed)
Pt received lunch 

## 2022-11-10 NOTE — ED Notes (Signed)
PT is sitting up in bed throwing up

## 2022-11-10 NOTE — Assessment & Plan Note (Signed)
Aspiration event associated with drug overdose CT of the chest with bilateral perihilar ground-glass changes  IV Unasyn Supplemental O2 as needed Monitor

## 2022-11-10 NOTE — ED Provider Notes (Signed)
[facility] Provider Note    Event Date/Time   First MD Initiated Contact with Patient 11/10/22 [phone]     (approximate)   History   Drug Overdose   HPI  Zachary Ballard is a 27 y.o. male brought to the ED via EMS from home status post accidental fentanyl overdose.  Patient given 2 mg intranasal Narcan with good response.  EMS reports room air saturation 89%, placed on 4 L nasal cannula oxygen.  Patient reports headache, labored breathing, hemoptysis.  States he just got out of rehab on Monday and found some Fentanyl in his bag.  Denies intentional SI/HI/AH/VH.  Denies abdominal pain, nausea, vomiting or dizziness.     Past Medical History   Past Medical History:  Diagnosis Date   Family history of adverse reaction to anesthesia    mom itches/ nausea/hard to get to sleep   Panic attacks    Patellar instability of left knee    s/p multiple surgeries by Dr. Ivin Poot at [facility]     Active Problem List   Patient Active Problem List   Diagnosis Date Noted   Generalized anxiety disorder with panic attacks 07/17/2022   Accidental drug overdose 09/27/2020     Past Surgical History   Past Surgical History:  Procedure Laterality Date   DENTAL SURGERY     KNEE ARTHROSCOPY Left    medial patellofemoral ligament repair   KNEE SURGERY  2018   Dr. Ivin Poot at [facility], hx of multiple surgeries   WISDOM TOOTH EXTRACTION       Home Medications   Prior to Admission medications   Medication Sig Start Date End Date Taking? Authorizing Provider  acetaminophen (TYLENOL) 500 MG tablet Take 500 mg by mouth every 6 (six) hours as needed for mild pain or moderate pain.    [provider]  escitalopram (LEXAPRO) 10 MG tablet Take 1 tablet (10 mg total) by mouth daily. 07/17/22 08/16/22  Sherlyn Hay, DO     Allergies  Nsaids and Codeine   Family History   Family History  Problem Relation Age of Onset   Epilepsy Mother    Other Father        unknown  medical history   Panic disorder Maternal Aunt      Physical Exam  Triage Vital Signs: ED Triage Vitals  Encounter Vitals Group     BP 11/10/22 0051 124/85     Systolic BP Percentile --      Diastolic BP Percentile --      Pulse Rate 11/10/22 0051 (!) 108     Resp 11/10/22 0051 19     Temp 11/10/22 0051 98.9 F (37.2 C)     Temp src --      SpO2 11/10/22 0051 93 %     Weight 11/10/22 0051 200 lb (90.7 kg)     Height 11/10/22 0051 5\' 9"  (1.753 m)     Head Circumference --      Peak Flow --      Pain Score 11/10/22 0050 0     Pain Loc --      Pain Education --      Exclude from Growth Chart --     Updated Vital Signs: BP 114/70   Pulse 87   Temp 98.9 F (37.2 C)   Resp 15   Ht 5\' 9"  (1.753 m)   Wt 90.7 kg   SpO2 95%   BMI 29.53 kg/m    General:  Awake, mild distress.  CV:  Tachycardic.  Good peripheral perfusion.  Resp:  Normal effort.  Diminished, otherwise CTAB. Abd:  Nontender.  No distention.  Other:  PERRL.  EOMI.  Alert and oriented x 3.  CN II-XII grossly intact.  5/5 motor strength and sensation all extremities.  MAE x 4.   ED Results / Procedures / Treatments  Labs (all labs ordered are listed, but only abnormal results are displayed) Labs Reviewed  COMPREHENSIVE METABOLIC PANEL - Abnormal; Notable for the following components:      Result Value   Glucose, Bld 175 (*)    Creatinine, Ser 1.33 (*)    AST 44 (*)    ALT 88 (*)    All other components within normal limits  SALICYLATE LEVEL - Abnormal; Notable for the following components:   Salicylate Lvl <7.0 (*)    All other components within normal limits  ACETAMINOPHEN LEVEL - Abnormal; Notable for the following components:   Acetaminophen (Tylenol), Serum <10 (*)    All other components within normal limits  CBC - Abnormal; Notable for the following components:   WBC 19.1 (*)    All other components within normal limits  DIFFERENTIAL - Abnormal; Notable for the following components:   Neutro  Abs 15.9 (*)    Abs Immature Granulocytes 0.13 (*)    All other components within normal limits  ETHANOL  URINE DRUG SCREEN, QUALITATIVE ([facility] ONLY)     EKG  ED ECG REPORT I, Shaeleigh Graw J, the attending physician, personally viewed and interpreted this ECG.   Date: 11/10/2022  EKG Time: 0053  Rate: 107  Rhythm: sinus tachycardia  Axis: Normal  Intervals:none  ST&T Change: Nonspecific    RADIOLOGY I have independently visualized and interpreted patient's CT scans as well as noted the radiology interpretation:  CT head: No ICH  CT chest: Noncardiogenic pulmonary edema versus aspiration  Official radiology report(s): CT Chest W Contrast  Result Date: 11/10/2022 CLINICAL DATA:  27 year old male with hemoptysis.  Drug overdose. EXAM: CT CHEST WITH CONTRAST TECHNIQUE: Multidetector CT imaging of the chest was performed during intravenous contrast administration. RADIATION DOSE REDUCTION: This exam was performed according to the departmental dose-optimization program which includes automated exposure control, adjustment of the mA and/or kV according to patient size and/or use of iterative reconstruction technique. CONTRAST:  75mL OMNIPAQUE IOHEXOL 300 MG/ML  SOLN COMPARISON:  Chest radiographs 07/11/2022. Prior chest CTA 09/27/2020. FINDINGS: Cardiovascular: Heart size is at the upper limits of normal. No pericardial effusion. Major mediastinal vascular structures appear unremarkable. Mediastinum/Nodes: Negative. No mediastinal mass or lymphadenopathy. Lungs/Pleura: Major airways are patent with relatively normal lung volumes. Confluent bilateral perihilar peribronchial ground-glass opacity which is fairly symmetric (series 3, image 51). No superimposed septal thickening. No pleural effusion or consolidation. Mild superimposed lower lobe dependent atelectasis. No pneumothorax. Upper Abdomen: Negative visible liver, gallbladder, spleen (splenule normal variant), pancreas, adrenal glands,  kidneys, and bowel in the upper abdomen. No upper abdominal free air or free fluid. Musculoskeletal: Age advanced although chronic and stable mid and lower thoracic vertebral endplate degeneration, irregularity. No acute osseous abnormality identified. IMPRESSION: 1. Confluent and symmetric bilateral perihilar ground-glass opacity. In this setting favor noncardiogenic pulmonary edema, with aspiration or infection felt less likely. No pleural effusion or consolidation. 2. Otherwise negative CT appearance of the Chest. Electronically Signed   By: Odessa Fleming M.D.   On: 11/10/2022 05:24   CT Head Wo Contrast  Result Date: 11/10/2022 CLINICAL DATA:  27 year old male with altered  mental status. Drug overdose. EXAM: CT HEAD WITHOUT CONTRAST TECHNIQUE: Contiguous axial images were obtained from the base of the skull through the vertex without intravenous contrast. RADIATION DOSE REDUCTION: This exam was performed according to the departmental dose-optimization program which includes automated exposure control, adjustment of the mA and/or kV according to patient size and/or use of iterative reconstruction technique. COMPARISON:  None Available. FINDINGS: Brain: No midline shift, ventriculomegaly, mass effect, evidence of mass lesion, intracranial hemorrhage or evidence of cortically based acute infarction. Gray-white matter differentiation is within normal limits throughout the brain. Vascular: No suspicious intracranial vascular hyperdensity. Skull: Intact. Sinuses/Orbits: Visualized paranasal sinuses and mastoids are well aerated. Other: Visualized orbits and scalp soft tissues are within normal limits. IMPRESSION: Normal noncontrast Head CT. Electronically Signed   By: Odessa Fleming M.D.   On: 11/10/2022 04:15     PROCEDURES:  Critical Care performed: Yes, see critical care procedure note(s)  CRITICAL CARE Performed by: Irean Hong   Total critical care time: 30 minutes  Critical care time was exclusive of  separately billable procedures and treating other patients.  Critical care was necessary to treat or prevent imminent or life-threatening deterioration.  Critical care was time spent personally by me on the following activities: development of treatment plan with patient and/or surrogate as well as nursing, discussions with consultants, evaluation of patient's response to treatment, examination of patient, obtaining history from patient or surrogate, ordering and performing treatments and interventions, ordering and review of laboratory studies, ordering and review of radiographic studies, pulse oximetry and re-evaluation of patient's condition.   Marland Kitchen1-3 Lead EKG Interpretation  Performed by: Irean Hong, MD Authorized by: Irean Hong, MD     Interpretation: abnormal     ECG rate:  105   ECG rate assessment: tachycardic     Rhythm: sinus tachycardia     Ectopy: none     Conduction: normal   Comments:     Patient placed on cardiac monitor to evaluate for arrhythmias    MEDICATIONS ORDERED IN ED: Medications  Ampicillin-Sulbactam (UNASYN) 3 g in sodium chloride 0.9 % 100 mL IVPB (has no administration in time range)  sodium chloride 0.9 % bolus 1,000 mL (0 mLs Intravenous Stopped 11/10/22 0510)  ondansetron (ZOFRAN) injection 4 mg (4 mg Intravenous Given 11/10/22 0317)  iohexol (OMNIPAQUE) 300 MG/ML solution 75 mL (75 mLs Intravenous Contrast Given 11/10/22 0404)     IMPRESSION / MDM / ASSESSMENT AND PLAN / ED COURSE  I reviewed the triage vital signs and the nursing notes.                             27 year old male presenting status post accidental fentanyl overdose. Differential diagnosis includes, but is not limited to, alcohol, illicit or prescription medications, or other toxic ingestion; intracranial pathology such as stroke or intracerebral hemorrhage; fever or infectious causes including sepsis; hypoxemia and/or hypercarbia; uremia; trauma; endocrine related disorders such as  diabetes, hypoglycemia, and thyroid-related diseases; hypertensive encephalopathy; etc. I have personally reviewed patient's records and note a PCP office visit on 07/17/2022 for generalized anxiety disorder.  Patient's presentation is most consistent with acute presentation with potential threat to life or bodily function.  The patient is on the cardiac monitor to evaluate for evidence of arrhythmia and/or significant heart rate changes.  Laboratory results demonstrate leukocytosis with WBC 19, mild AKI with creatinine 1.33 elevated from prior, minimally elevated transaminases without elevation of bilirubin.  Toxicological panel negative for acetaminophen, salicylate and ethanol.  Will obtain CT head given patient's complaint of headache, CT chest given complaints of hemoptysis and hypoxia triage.  Initiate IV fluid resuscitation, IV Zofran for nausea and reassess.  Clinical Course as of 11/10/22 0542  Wynelle Link Nov 10, 2022  4098 Updated patient on CT scan concerning for edema versus infection.  Will cover with IV Unasyn for probable aspiration.  Will discuss with hospitalist services for evaluation and admission. [JS]    Clinical Course User Index [JS] Irean Hong, MD     FINAL CLINICAL IMPRESSION(S) / ED DIAGNOSES   Final diagnoses:  Accidental fentanyl overdose, initial encounter (HCC)  Hypoxia  Aspiration pneumonia, unspecified aspiration pneumonia type, unspecified laterality, unspecified part of lung (HCC)  Noncardiogenic pulmonary edema     Rx / DC Orders   ED Discharge Orders     None        Note:  This document was prepared using Dragon voice recognition software and may include unintentional dictation errors.   Irean Hong, MD 11/10/22 [phone]

## 2022-11-11 DIAGNOSIS — T50904A Poisoning by unspecified drugs, medicaments and biological substances, undetermined, initial encounter: Secondary | ICD-10-CM | POA: Diagnosis not present

## 2022-11-11 LAB — PROCALCITONIN: Procalcitonin: <0.1 ng/mL

## 2022-11-11 MED ORDER — FLUOXETINE HCL 20 MG PO CAPS: 20.0000 mg | ORAL_CAPSULE | Freq: Every day | ORAL | 2 refills | Status: AC

## 2022-11-11 MED ORDER — HYDROXYZINE HCL 25 MG PO TABS: 50.0000 mg | ORAL_TABLET | Freq: Once | ORAL | Status: DC

## 2022-11-11 MED ORDER — LORAZEPAM 2 MG/ML IJ SOLN
0.5000 mg | Freq: Once | INTRAMUSCULAR | Status: AC | PRN
  Administered 2022-11-11: 0.5 mg via INTRAVENOUS
  Filled 2022-11-11: qty 1

## 2022-11-11 MED ORDER — FUROSEMIDE 10 MG/ML IJ SOLN: 40.0000 mg | Freq: Once | INTRAMUSCULAR | Status: DC

## 2022-11-11 MED ORDER — NITROGLYCERIN 0.4 MG SL SUBL: 0.4000 mg | SUBLINGUAL_TABLET | SUBLINGUAL | Status: DC | PRN

## 2022-11-11 NOTE — Discharge Summary (Signed)
Physician Discharge Summary   ALEXI HERRERO  male DOB: [birth year]  XLK:[id number]  PCP: Sherlyn Hay, DO  Admit date: 11/10/2022 Discharge date: 11/11/2022  Admitted From: home Disposition:  home CODE STATUS: Full code   Hospital Course:  For full details, please see H&P, progress notes, consult notes and ancillary notes.  Briefly,  KOHL LAUDERMAN is a 27 y.o. male with medical history significant of polysubstance abuse presenting with drug overdose.    Patient reported recently coming out of a [place] drug abstinence program.  Patient states has been clean for roughly 1 year with noted prior polysubstance abuse in the past.  Patient states he took an unknown pill when he got home (later reported it to be Fentanyl).  Patient subsequently blacked out.  Per report, patient was found unconscious.  EMS subsequently called.  Patient given intranasal Narcan.  Given a total of 2 mg of IV Narcan as well.  Patient reports some shortness of breath as well as some mild pain with deep breathing.  Since improved.   * Accidental Overdose history of polysubstance abuse  --vitals stable and pt was alert and oriented the day after admission.   --psych consulted and rec outpatient psych f/u.  Severe anxiety --pt reported anxiety and requested frequent Ativan.  Pt was not taking Lexapro or Atarax PTA. --psych consulted and rec Prozac for anxiety (via secure chat discussion) although pt was not willing to start.  Rx for Prozac provided at discharge.   --Outpatient psych f/u for benzo Rx.   Aspiration pneumonia, ruled out --CT chest read "Confluent and symmetric bilateral perihilar ground-glass opacity.  In this setting favor noncardiogenic pulmonary edema, with aspiration or infection felt less likely." --pt was started on Unasyn on presentation.  Pt had no hypoxia or respiratory symptoms.  Abx was not continued.  No current need for diuretic.    Discharge Diagnoses:  Principal Problem:    Overdose Active Problems:   Aspiration pneumonia (HCC)   30 Day Unplanned Readmission Risk Score    Flowsheet Row ED to Hosp-Admission (Current) from 11/10/2022 in [facility]  30 Day Unplanned Readmission Risk Score (%) 10.95 Filed at 11/11/2022 1200       This score is the patient's risk of an unplanned readmission within 30 days of being discharged (0 -100%). The score is based on dignosis, age, lab data, medications, orders, and past utilization.   Low:  0-14.9   Medium: 15-21.9   High: 22-29.9   Extreme: 30 and above         Discharge Instructions:  Allergies as of 11/11/2022       Reactions   Nsaids Other (See Comments)   Severe stomach upset   Codeine Rash        Medication List     STOP taking these medications    escitalopram 10 MG tablet Commonly known as: Lexapro   hydrOXYzine 25 MG tablet Commonly known as: ATARAX   ondansetron 4 MG disintegrating tablet Commonly known as: ZOFRAN-ODT       TAKE these medications    acetaminophen 500 MG tablet Commonly known as: TYLENOL Take 500 mg by mouth every 6 (six) hours as needed for mild pain or moderate pain.   FLUoxetine 20 MG capsule Commonly known as: PROZAC Take 1 capsule (20 mg total) by mouth daily.   pantoprazole 40 MG tablet Commonly known as: PROTONIX Take 40 mg by mouth every morning.  Follow-up Information     Sherlyn Hay, DO Follow up in 1 week(s).   Specialty: Family Medicine Contact information: [address] [place] [address] [phone]                 Allergies  Allergen Reactions   Nsaids Other (See Comments)    Severe stomach upset   Codeine Rash     The results of significant diagnostics from this hospitalization (including imaging, microbiology, ancillary and laboratory) are listed below for reference.   Consultations:   Procedures/Studies: CT Chest W Contrast  Result Date: 11/10/2022 CLINICAL  DATA:  27 year old male with hemoptysis.  Drug overdose. EXAM: CT CHEST WITH CONTRAST TECHNIQUE: Multidetector CT imaging of the chest was performed during intravenous contrast administration. RADIATION DOSE REDUCTION: This exam was performed according to the departmental dose-optimization program which includes automated exposure control, adjustment of the mA and/or kV according to patient size and/or use of iterative reconstruction technique. CONTRAST:  75mL OMNIPAQUE IOHEXOL 300 MG/ML  SOLN COMPARISON:  Chest radiographs 07/11/2022. Prior chest CTA 09/27/2020. FINDINGS: Cardiovascular: Heart size is at the upper limits of normal. No pericardial effusion. Major mediastinal vascular structures appear unremarkable. Mediastinum/Nodes: Negative. No mediastinal mass or lymphadenopathy. Lungs/Pleura: Major airways are patent with relatively normal lung volumes. Confluent bilateral perihilar peribronchial ground-glass opacity which is fairly symmetric (series 3, image 51). No superimposed septal thickening. No pleural effusion or consolidation. Mild superimposed lower lobe dependent atelectasis. No pneumothorax. Upper Abdomen: Negative visible liver, gallbladder, spleen (splenule normal variant), pancreas, adrenal glands, kidneys, and bowel in the upper abdomen. No upper abdominal free air or free fluid. Musculoskeletal: Age advanced although chronic and stable mid and lower thoracic vertebral endplate degeneration, irregularity. No acute osseous abnormality identified. IMPRESSION: 1. Confluent and symmetric bilateral perihilar ground-glass opacity. In this setting favor noncardiogenic pulmonary edema, with aspiration or infection felt less likely. No pleural effusion or consolidation. 2. Otherwise negative CT appearance of the Chest. Electronically Signed   By: Odessa Fleming M.D.   On: 11/10/2022 05:24   CT Head Wo Contrast  Result Date: 11/10/2022 CLINICAL DATA:  27 year old male with altered mental status. Drug overdose.  EXAM: CT HEAD WITHOUT CONTRAST TECHNIQUE: Contiguous axial images were obtained from the base of the skull through the vertex without intravenous contrast. RADIATION DOSE REDUCTION: This exam was performed according to the departmental dose-optimization program which includes automated exposure control, adjustment of the mA and/or kV according to patient size and/or use of iterative reconstruction technique. COMPARISON:  None Available. FINDINGS: Brain: No midline shift, ventriculomegaly, mass effect, evidence of mass lesion, intracranial hemorrhage or evidence of cortically based acute infarction. Gray-white matter differentiation is within normal limits throughout the brain. Vascular: No suspicious intracranial vascular hyperdensity. Skull: Intact. Sinuses/Orbits: Visualized paranasal sinuses and mastoids are well aerated. Other: Visualized orbits and scalp soft tissues are within normal limits. IMPRESSION: Normal noncontrast Head CT. Electronically Signed   By: Odessa Fleming M.D.   On: 11/10/2022 04:15      Labs: BNP (last 3 results) No results for input(s): "BNP" in the last 8760 hours. Basic Metabolic Panel: Recent Labs  Lab 11/10/22 0052  NA 136  K 3.9  CL 98  CO2 27  GLUCOSE 175*  BUN 14  CREATININE 1.33*  CALCIUM 8.9   Liver Function Tests: Recent Labs  Lab 11/10/22 0052  AST 44*  ALT 88*  ALKPHOS 117  BILITOT 0.7  PROT 7.9  ALBUMIN 4.2   No results for input(s): "  LIPASE", "AMYLASE" in the last 168 hours. No results for input(s): "AMMONIA" in the last 168 hours. CBC: Recent Labs  Lab 11/10/22 0052  WBC 19.1*  NEUTROABS 15.9*  HGB 16.6  HCT 48.5  MCV 88.2  PLT 322   Cardiac Enzymes: No results for input(s): "CKTOTAL", "CKMB", "CKMBINDEX", "TROPONINI" in the last 168 hours. BNP: Invalid input(s): "POCBNP" CBG: No results for input(s): "GLUCAP" in the last 168 hours. D-Dimer No results for input(s): "DDIMER" in the last 72 hours. Hgb A1c No results for input(s):  "HGBA1C" in the last 72 hours. Lipid Profile No results for input(s): "CHOL", "HDL", "LDLCALC", "TRIG", "CHOLHDL", "LDLDIRECT" in the last 72 hours. Thyroid function studies No results for input(s): "TSH", "T4TOTAL", "T3FREE", "THYROIDAB" in the last 72 hours.  Invalid input(s): "FREET3" Anemia work up No results for input(s): "VITAMINB12", "FOLATE", "FERRITIN", "TIBC", "IRON", "RETICCTPCT" in the last 72 hours. Urinalysis    Component Value Date/Time   COLORURINE YELLOW (A) 09/27/2020 0506   APPEARANCEUR HAZY (A) 09/27/2020 0506   LABSPEC 1.016 09/27/2020 0506   PHURINE 5.0 09/27/2020 0506   GLUCOSEU >=500 (A) 09/27/2020 0506   HGBUR NEGATIVE 09/27/2020 0506   BILIRUBINUR NEGATIVE 09/27/2020 0506   KETONESUR NEGATIVE 09/27/2020 0506   PROTEINUR NEGATIVE 09/27/2020 0506   NITRITE NEGATIVE 09/27/2020 0506   LEUKOCYTESUR NEGATIVE 09/27/2020 0506   Sepsis Labs Recent Labs  Lab 11/10/22 0052  WBC 19.1*   Microbiology No results found for this or any previous visit (from the past 240 hour(s)).   Total time spend on discharging this patient, including the last patient exam, discussing the hospital stay, instructions for ongoing care as it relates to all pertinent caregivers, as well as preparing the medical discharge records, prescriptions, and/or referrals as applicable, is 45 minutes.    Darlin Priestly, MD  [facility] 11/11/2022, 3:27 PM

## 2022-11-11 NOTE — Progress Notes (Signed)
Pt repeatedly asking if providers have changed or increased anxiety medication. Pt states he asked about this issue in the ED. Provider Para March MD notified by me per pt request. No new orders were placed-pt then stated that if he doesn't get something different/additional for anxiety then he will probably end up having full body tremors. Will continue to monitor pt and will notify oncoming shift nurse of pt statements and requests. [place] Hollar S Adana Marik

## 2022-11-11 NOTE — Plan of Care (Signed)
  Problem: Education: Goal: Knowledge of General Education information will improve Description: Including pain rating scale, medication(s)/side effects and non-pharmacologic comfort measures Outcome: Progressing   Problem: Health Behavior/Discharge Planning: Goal: Ability to manage health-related needs will improve Outcome: Progressing   Problem: Activity: Goal: Risk for activity intolerance will decrease Outcome: Progressing   Problem: Nutrition: Goal: Adequate nutrition will be maintained Outcome: Progressing   Problem: Elimination: Goal: Will not experience complications related to bowel motility Outcome: Progressing Goal: Will not experience complications related to urinary retention Outcome: Progressing   Problem: Coping: Goal: Level of anxiety will decrease Outcome: Not Progressing   Problem: Pain Managment: Goal: General experience of comfort will improve Outcome: Not Progressing

## 2022-11-11 NOTE — Consult Note (Signed)
[facility] Face-to-Face Psychiatry Consult   Reason for Consult: Anxiety Referring Physician: Dr. Fran Lowes Patient Identification: Zachary Ballard MRN:  [id number] Principal Diagnosis: Overdose Diagnosis:  Principal Problem:   Overdose Active Problems:   Aspiration pneumonia (HCC)   Total Time spent with patient: 20 minutes  Subjective:   Zachary Ballard is a 27 y.o. male patient admitted with status post accidental overdose on fentanyl.  He thought it was regular pain medication.  HPI: Zachary Ballard has a long history of depression and anxiety and has seen psychiatrists in the past and has been on numerous medications including benzodiazepines and SSRIs.  He is not interested in trying any new medications.  Past Psychiatric History: As above  Risk to Self:   Risk to Others:   Prior Inpatient Therapy:   Prior Outpatient Therapy:    Past Medical History:  Past Medical History:  Diagnosis Date   Family history of adverse reaction to anesthesia    mom itches/ nausea/hard to get to sleep   Panic attacks    Patellar instability of left knee    s/p multiple surgeries by Dr. Ivin Poot at [facility]    Past Surgical History:  Procedure Laterality Date   DENTAL SURGERY     KNEE ARTHROSCOPY Left    medial patellofemoral ligament repair   KNEE SURGERY  2018   Dr. Ivin Poot at [facility], hx of multiple surgeries   WISDOM TOOTH EXTRACTION     Family History:  Family History  Problem Relation Age of Onset   Epilepsy Mother    Other Father        unknown medical history   Panic disorder Maternal Aunt    Family Psychiatric  History: Unremarkable Social History:  Social History   Substance and Sexual Activity  Alcohol Use Not Currently     Social History   Substance and Sexual Activity  Drug Use Not Currently   Types: Marijuana   Comment: used in high school once    Social History   Socioeconomic History   Marital status: Single    Spouse name: Not on file   Number of children: Not on file   Years of  education: Not on file   Highest education level: Not on file  Occupational History   Not on file  Tobacco Use   Smoking status: Former   Smokeless tobacco: Former    Types: Engineer, drilling   Vaping status: Former  Substance and Sexual Activity   Alcohol use: Not Currently   Drug use: Not Currently    Types: Marijuana    Comment: used in high school once   Sexual activity: Not Currently  Other Topics Concern   Not on file  Social History Narrative   Not on file   Social Determinants of Health   Financial Resource Strain: Not on file  Food Insecurity: No Food Insecurity (11/11/2022)   Hunger Vital Sign    Worried About Running Out of Food in the Last Year: Never true    Ran Out of Food in the Last Year: Never true  Transportation Needs: No Transportation Needs (11/11/2022)   PRAPARE - Administrator, Civil Service (Medical): No    Lack of Transportation (Non-Medical): No  Physical Activity: Not on file  Stress: Not on file  Social Connections: Not on file   Additional Social History:    Allergies:   Allergies  Allergen Reactions   Nsaids Other (See Comments)    Severe stomach upset  Codeine Rash    Labs:  Results for orders placed or performed during the hospital encounter of 11/10/22 (from the past 48 hour(s))  Comprehensive metabolic panel     Status: Abnormal   Collection Time: 11/10/22 12:52 AM  Result Value Ref Range   Sodium 136 135 - 145 mmol/L   Potassium 3.9 3.5 - 5.1 mmol/L   Chloride 98 98 - 111 mmol/L   CO2 27 22 - 32 mmol/L   Glucose, Bld 175 (H) 70 - 99 mg/dL    Comment: Glucose reference range applies only to samples taken after fasting for at least 8 hours.   BUN 14 6 - 20 mg/dL   Creatinine, Ser 1.61 (H) 0.61 - 1.24 mg/dL   Calcium 8.9 8.9 - 09.6 mg/dL   Total Protein 7.9 6.5 - 8.1 g/dL   Albumin 4.2 3.5 - 5.0 g/dL   AST 44 (H) 15 - 41 U/L   ALT 88 (H) 0 - 44 U/L   Alkaline Phosphatase 117 38 - 126 U/L   Total Bilirubin 0.7  0.3 - 1.2 mg/dL   GFR, Estimated >04 >54 mL/min    Comment: (NOTE) Calculated using the CKD-EPI Creatinine Equation (2021)    Anion gap 11 5 - 15    Comment: Performed at [facility], [address] [place], [place] [address]  Ethanol     Status: None   Collection Time: 11/10/22 12:52 AM  Result Value Ref Range   Alcohol, Ethyl (B) <10 <10 mg/dL    Comment: (NOTE) Lowest detectable limit for serum alcohol is 10 mg/dL.  For medical purposes only. Performed at [facility], [address] [place], [place] [address]   Salicylate level     Status: Abnormal   Collection Time: 11/10/22 12:52 AM  Result Value Ref Range   Salicylate Lvl <7.0 (L) 7.0 - 30.0 mg/dL    Comment: Performed at [facility], [address] [place], [place] [address]  Acetaminophen level     Status: Abnormal   Collection Time: 11/10/22 12:52 AM  Result Value Ref Range   Acetaminophen (Tylenol), Serum <10 (L) 10 - 30 ug/mL    Comment: (NOTE) Therapeutic concentrations vary significantly. A range of 10-30 ug/mL  may be an effective concentration for many patients. However, some  are best treated at concentrations outside of this range. Acetaminophen concentrations >150 ug/mL at 4 hours after ingestion  and >50 ug/mL at 12 hours after ingestion are often associated with  toxic reactions.  Performed at [facility], [address] [place], [place] [address]   cbc     Status: Abnormal   Collection Time: 11/10/22 12:52 AM  Result Value Ref Range   WBC 19.1 (H) 4.0 - 10.5 K/uL   RBC 5.50 4.22 - 5.81 MIL/uL   Hemoglobin 16.6 13.0 - 17.0 g/dL   HCT 57.8 46.9 - 62.9 %   MCV 88.2 80.0 - 100.0 fL   MCH 30.2 26.0 - 34.0 pg   MCHC 34.2 30.0 - 36.0 g/dL   RDW 52.8 41.3 - 24.4 %   Platelets 322 150 - 400 K/uL   nRBC 0.0 0.0 - 0.2 %    Comment: Performed at [facility], [address] [place], [place] [address]  Differential     Status: Abnormal   Collection  Time: 11/10/22 12:52 AM  Result Value Ref Range   Neutrophils Relative % 82 %   Neutro Abs 15.9 (H) 1.7 - 7.7 K/uL  Lymphocytes Relative 11 %   Lymphs Abs 2.1 0.7 - 4.0 K/uL   Monocytes Relative 5 %   Monocytes Absolute 0.9 0.1 - 1.0 K/uL   Eosinophils Relative 1 %   Eosinophils Absolute 0.1 0.0 - 0.5 K/uL   Basophils Relative 0 %   Basophils Absolute 0.1 0.0 - 0.1 K/uL   Immature Granulocytes 1 %   Abs Immature Granulocytes 0.13 (H) 0.00 - 0.07 K/uL    Comment: Performed at [facility], [address], [place], [place] [address]  Urine Drug Screen, Qualitative     Status: None   Collection Time: 11/10/22  7:37 AM  Result Value Ref Range   Tricyclic, Ur Screen NONE DETECTED NONE DETECTED   Amphetamines, Ur Screen NONE DETECTED NONE DETECTED   MDMA (Ecstasy)Ur Screen NONE DETECTED NONE DETECTED   Cocaine Metabolite,Ur [place] NONE DETECTED NONE DETECTED   Opiate, Ur Screen NONE DETECTED NONE DETECTED   Phencyclidine (PCP) Ur S NONE DETECTED NONE DETECTED   Cannabinoid 50 Ng, Ur  NONE DETECTED NONE DETECTED   Barbiturates, Ur Screen NONE DETECTED NONE DETECTED   Benzodiazepine, Ur Scrn NONE DETECTED NONE DETECTED   Methadone Scn, Ur NONE DETECTED NONE DETECTED    Comment: (NOTE) Tricyclics + metabolites, urine    Cutoff 1000 ng/mL Amphetamines + metabolites, urine  Cutoff 1000 ng/mL MDMA (Ecstasy), urine              Cutoff 500 ng/mL Cocaine Metabolite, urine          Cutoff 300 ng/mL Opiate + metabolites, urine        Cutoff 300 ng/mL Phencyclidine (PCP), urine         Cutoff 25 ng/mL Cannabinoid, urine                 Cutoff 50 ng/mL Barbiturates + metabolites, urine  Cutoff 200 ng/mL Benzodiazepine, urine              Cutoff 200 ng/mL Methadone, urine                   Cutoff 300 ng/mL  The urine drug screen provides only a preliminary, unconfirmed analytical test result and should not be used for non-medical purposes. Clinical consideration and professional  judgment should be applied to any positive drug screen result due to possible interfering substances. A more specific alternate chemical method must be used in order to obtain a confirmed analytical result. Gas chromatography / mass spectrometry (GC/MS) is the preferred confirm atory method. Performed at [facility], [address] [place], [place] [address]   Procalcitonin     Status: None   Collection Time: 11/11/22  8:54 AM  Result Value Ref Range   Procalcitonin <0.10 ng/mL    Comment:        Interpretation: PCT (Procalcitonin) <= 0.5 ng/mL: Systemic infection (sepsis) is not likely. Local bacterial infection is possible. (NOTE)       Sepsis PCT Algorithm           Lower Respiratory Tract                                      Infection PCT Algorithm    ----------------------------     ----------------------------         PCT < 0.25 ng/mL                PCT <  0.10 ng/mL          Strongly encourage             Strongly discourage   discontinuation of antibiotics    initiation of antibiotics    ----------------------------     -----------------------------       PCT 0.25 - 0.50 ng/mL            PCT 0.10 - 0.25 ng/mL               OR       >80% decrease in PCT            Discourage initiation of                                            antibiotics      Encourage discontinuation           of antibiotics    ----------------------------     -----------------------------         PCT >= 0.50 ng/mL              PCT 0.26 - 0.50 ng/mL               AND        <80% decrease in PCT             Encourage initiation of                                             antibiotics       Encourage continuation           of antibiotics    ----------------------------     -----------------------------        PCT >= 0.50 ng/mL                  PCT > 0.50 ng/mL               AND         increase in PCT                  Strongly encourage                                       initiation of antibiotics    Strongly encourage escalation           of antibiotics                                     -----------------------------                                           PCT <= 0.25 ng/mL                                                 OR                                        >  80% decrease in PCT                                      Discontinue / Do not initiate                                             antibiotics  Performed at [facility], [address] [place], [place] [address]     Current Facility-Administered Medications  Medication Dose Route Frequency Provider Last Rate Last Admin   acetaminophen (TYLENOL) tablet 650 mg  650 mg Oral Q6H PRN Andris Baumann, MD       Or   acetaminophen (TYLENOL) suppository 650 mg  650 mg Rectal Q6H PRN Andris Baumann, MD       Ampicillin-Sulbactam (UNASYN) 3 g in sodium chloride 0.9 % 100 mL IVPB  3 g Intravenous Q6H Floydene Flock, MD 200 mL/hr at 11/11/22 1211 3 g at 11/11/22 1211   enoxaparin (LOVENOX) injection 40 mg  40 mg Subcutaneous Q24H Floydene Flock, MD       LORazepam (ATIVAN) tablet 0.5 mg  0.5 mg Oral Q4H PRN Floydene Flock, MD   0.5 mg at 11/11/22 0921   ondansetron (ZOFRAN) tablet 4 mg  4 mg Oral Q6H PRN Andris Baumann, MD       Or   ondansetron [facility]) injection 4 mg  4 mg Intravenous Q6H PRN Andris Baumann, MD   4 mg at 11/11/22 1342    Musculoskeletal: Strength & Muscle Tone: within normal limits Gait & Station: normal Patient leans: N/A            Psychiatric Specialty Exam:  Presentation  General Appearance: No data recorded Eye Contact:No data recorded Speech:No data recorded Speech Volume:No data recorded Handedness:No data recorded  Mood and Affect  Mood:No data recorded Affect:No data recorded  Thought Process  Thought Processes:No data recorded Descriptions of Associations:No data recorded Orientation:No data recorded Thought Content:No data  recorded History of Schizophrenia/Schizoaffective disorder:No data recorded Duration of Psychotic Symptoms:No data recorded Hallucinations:No data recorded Ideas of Reference:No data recorded Suicidal Thoughts:No data recorded Homicidal Thoughts:No data recorded  Sensorium  Memory:No data recorded Judgment:No data recorded Insight:No data recorded  Executive Functions  Concentration:No data recorded Attention Span:No data recorded Recall:No data recorded Fund of Knowledge:No data recorded Language:No data recorded  Psychomotor Activity  Psychomotor Activity:No data recorded  Assets  Assets:No data recorded  Sleep  Sleep:No data recorded  He is 27 year old white male who is alert and oriented x 3.  Pleasant and cooperative.  Mood and affect are mostly euthymic and he does not look very anxious.  Thought process is goal directed.  Thought content: He denies suicidal ideation, homicidal ideation, auditory or visual hallucinations.  Judgment and insight are good.  Blood pressure (!) 123/92, pulse (!) 126, temperature 98.1 F (36.7 C), resp. rate 18, height 5\' 9"  (1.753 m), weight 95.2 kg, SpO2 100%. Body mass index is 30.98 kg/m.  Treatment Plan Summary: Plan recommended outpatient therapist.  Disposition: No evidence of imminent risk to self or others at present.   Patient does not meet criteria for psychiatric inpatient admission.  Sarina Ill, DO 11/11/2022 2:04 PM

## 2022-12-13 ENCOUNTER — Other Ambulatory Visit: Payer: Self-pay | Admitting: Family Medicine

## 2022-12-13 DIAGNOSIS — F410 Panic disorder [episodic paroxysmal anxiety] without agoraphobia: Secondary | ICD-10-CM

## 2022-12-16 NOTE — Telephone Encounter (Signed)
Requested medication (s) are due for refill today: yes  Requested medication (s) are on the active medication list: yes  Last refill:  07/17/22  Future visit scheduled: yes  Notes to clinic:  Unable to refill per protocol, cannot delegate.      Requested Prescriptions  Pending Prescriptions Disp Refills   ALPRAZolam (XANAX) 1 MG tablet [Pharmacy Med Name: ALPRAZOLAM 1MG  TABLETS] 14 tablet     Sig: TAKE 1 TABLET(1 MG) BY MOUTH TWICE DAILY FOR UP TO 7 DAYS AS NEEDED FOR ANXIETY     Not Delegated - Psychiatry: Anxiolytics/Hypnotics 2 Failed - 12/13/2022  8:38 PM      Failed - This refill cannot be delegated      Passed - Urine Drug Screen completed in last 360 days      Passed - Patient is not pregnant      Passed - Valid encounter within last 6 months    Recent Outpatient Visits           5 months ago Generalized anxiety disorder with panic attacks   [facility] [place], Monico Blitz, DO

## 2022-12-19 ENCOUNTER — Other Ambulatory Visit: Payer: Self-pay | Admitting: Family Medicine

## 2022-12-19 DIAGNOSIS — F410 Panic disorder [episodic paroxysmal anxiety] without agoraphobia: Secondary | ICD-10-CM

## 2022-12-19 NOTE — Telephone Encounter (Signed)
Requested Prescriptions  Pending Prescriptions Disp Refills   escitalopram (LEXAPRO) 10 MG tablet [Pharmacy Med Name: ESCITALOPRAM 10MG  TABLETS] 30 tablet 0    Sig: TAKE 1 TABLET(10 MG) BY MOUTH DAILY     Psychiatry:  Antidepressants - SSRI Passed - 12/19/2022  5:37 PM      Passed - Valid encounter within last 6 months    Recent Outpatient Visits           5 months ago Generalized anxiety disorder with panic attacks   [facility], Monico Blitz, DO

## 2023-01-22 ENCOUNTER — Encounter: Payer: Self-pay | Admitting: Emergency Medicine

## 2023-01-22 ENCOUNTER — Emergency Department
Admission: EM | Admit: 2023-01-22 | Discharge: 2023-01-22 | Disposition: A | Discharge status: Home / Self Care | Payer: Medicaid Other | Attending: Emergency Medicine | Admitting: Emergency Medicine

## 2023-01-22 ENCOUNTER — Other Ambulatory Visit: Payer: Self-pay

## 2023-01-22 DIAGNOSIS — R4589 Other symptoms and signs involving emotional state: Secondary | ICD-10-CM

## 2023-01-22 DIAGNOSIS — F410 Panic disorder [episodic paroxysmal anxiety] without agoraphobia: Secondary | ICD-10-CM | POA: Insufficient documentation

## 2023-01-22 LAB — COMPREHENSIVE METABOLIC PANEL
ALT: 81 U/L — ABNORMAL HIGH (ref 0–44)
AST: 54 U/L — ABNORMAL HIGH (ref 15–41)
Albumin: 4 g/dL (ref 3.5–5.0)
Alkaline Phosphatase: 104 U/L (ref 38–126)
Anion gap: 12 (ref 5–15)
BUN: 11 mg/dL (ref 6–20)
CO2: 26 mmol/L (ref 22–32)
Calcium: 9.3 mg/dL (ref 8.9–10.3)
Chloride: 96 mmol/L — ABNORMAL LOW (ref 98–111)
Creatinine, Ser: 0.99 mg/dL (ref 0.61–1.24)
GFR, Estimated: >60 mL/min (ref >60)
Glucose, Bld: 119 mg/dL — ABNORMAL HIGH (ref 70–99)
Potassium: 3.4 mmol/L — ABNORMAL LOW (ref 3.5–5.1)
Sodium: 134 mmol/L — ABNORMAL LOW (ref 135–145)
Total Bilirubin: 0.4 mg/dL (ref <1.2)
Total Protein: 7.7 g/dL (ref 6.5–8.1)

## 2023-01-22 LAB — CBC
HCT: 45.6 % (ref 39.0–52.0)
Hemoglobin: 16.5 g/dL (ref 13.0–17.0)
MCH: 29.8 pg (ref 26.0–34.0)
MCHC: 36.2 g/dL — ABNORMAL HIGH (ref 30.0–36.0)
MCV: 82.5 fL (ref 80.0–100.0)
Platelets: 364 10*3/uL (ref 150–400)
RBC: 5.53 MIL/uL (ref 4.22–5.81)
RDW: 10.7 % — ABNORMAL LOW (ref 11.5–15.5)
WBC: 7.1 10*3/uL (ref 4.0–10.5)
nRBC: 0 % (ref 0.0–0.2)

## 2023-01-22 LAB — ETHANOL: Alcohol, Ethyl (B): <10 mg/dL (ref <10)

## 2023-01-22 LAB — ACETAMINOPHEN LEVEL: Acetaminophen (Tylenol), Serum: <10 ug/mL — ABNORMAL LOW (ref 10–30)

## 2023-01-22 LAB — SALICYLATE LEVEL: Salicylate Lvl: <7 mg/dL — ABNORMAL LOW (ref 7.0–30.0)

## 2023-01-22 MED ORDER — CLONAZEPAM 0.5 MG PO TABS
1.0000 mg | ORAL_TABLET | ORAL | Status: AC
  Administered 2023-01-22: 1 mg via ORAL
  Filled 2023-01-22: qty 2

## 2023-01-22 MED ORDER — CLONAZEPAM 0.5 MG PO TABS
0.5000 mg | ORAL_TABLET | Freq: Two times a day (BID) | ORAL | 0 refills | Status: DC | PRN
Start: 2023-01-22 — End: 2023-01-22

## 2023-01-22 MED ORDER — CLONAZEPAM 0.5 MG PO TABS
0.5000 mg | ORAL_TABLET | Freq: Two times a day (BID) | ORAL | 0 refills | Status: AC | PRN
Start: 2023-01-22 — End: 2023-01-25

## 2023-01-22 NOTE — Discharge Instructions (Addendum)
No driving within 8 hours of use of clonazepam.  Use only as prescribed.

## 2023-01-22 NOTE — ED Notes (Signed)
Pt. States he feels much better, much calmer, and his body is no longer tingling.

## 2023-01-22 NOTE — ED Triage Notes (Addendum)
Patient to ED via POV for panic attack. Pt states this has been ongoing on approx 45 minutes. States he used to have a PRN med but no longer has it. States his whole body is tingling. Patient unable to sit still during triage.

## 2023-01-22 NOTE — ED Provider Notes (Signed)
[facility] Provider Note    Event Date/Time   First MD Initiated Contact with Patient 01/22/23 1442     (approximate)   History   Panic Attack   HPI  Zachary Ballard is a 27 y.o. male who reports a history of ADHD, anxiety, panic attacks drug abuse history.  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    He has been suffering from significant lifelong anxiety.  Patient reports he also suffers from hypochondriasis. he advises that it has been about a year but he started having a panic attacks intermittently for the last 3 days.  In the past the use of clonazepam has been very helpful.  He actually has an upcoming appointment to reestablish psychiatric care but has not yet seen the physician  He relates no obvious inciting incident but started to feel a panic attack and stress coming on and has had intermittent feeling of panic this associated with a tingling feeling in his hands and feet and lips.  He advised in the past usually if he is to get medicated will help calm him and and he will do well.  No history of heart disease no history of lung disease.  History of remote orthopedic surgeries no history of blood clots.     Physical Exam   Triage Vital Signs: ED Triage Vitals  Encounter Vitals Group     BP 01/22/23 1412 (!) 160/144     Systolic BP Percentile --      Diastolic BP Percentile --      Pulse Rate 01/22/23 1412 (!) 105     Resp 01/22/23 1412 18     Temp 01/22/23 1412 98.1 F (36.7 C)     Temp Source 01/22/23 1412 Oral     SpO2 01/22/23 1412 100 %     Weight 01/22/23 1408 195 lb (88.5 kg)     Height 01/22/23 1408 5\' 9"  (1.753 m)     Head Circumference --      Peak Flow --      Pain Score 01/22/23 1408 0     Pain Loc --      Pain Education --      Exclude from Growth Chart --     Most recent vital signs: Vitals:   01/22/23  1412  BP: (!) 160/144  Pulse: (!) 105  Resp: 18  Temp: 98.1 F (36.7 C)  SpO2: 100%     General: Awake, no distress.  He appears anxious.  He is somewhat hyperventilating and tachypneic. CV:  Good peripheral perfusion.  Very mild tachycardia Resp:  Normal effort slight tachypnea.  Lung sounds are otherwise clear.  He appears in no distress or pain Abd:  No distention.  Other:     ED Results / Procedures / Treatments   Labs (all labs ordered are listed, but only abnormal results are displayed) Labs Reviewed  COMPREHENSIVE METABOLIC PANEL - Abnormal; Notable for the following components:      Result Value   Sodium 134 (*)    Potassium 3.4 (*)    Chloride 96 (*)    Glucose, Bld 119 (*)    AST 54 (*)    ALT 81 (*)    All other components within normal limits  SALICYLATE LEVEL - Abnormal; Notable for the following components:   Salicylate Lvl <7.0 (*)    All other  components within normal limits  ACETAMINOPHEN LEVEL - Abnormal; Notable for the following components:   Acetaminophen (Tylenol), Serum <10 (*)    All other components within normal limits  CBC - Abnormal; Notable for the following components:   MCHC 36.2 (*)    RDW 10.7 (*)    All other components within normal limits  ETHANOL  URINE DRUG SCREEN, QUALITATIVE ([facility] ONLY)   Labs interpreted as a very mild and chronic transaminitis.  Very mild hyponatremia hypokalemia.  EKG  Pending, Dr. Derrill Kay to review   RADIOLOGY Lung sounds clear normal oxygen saturation.  Established history of similar symptoms in the past attributed to anxiety, doubt imaging of benefit today.  No concern for pneumothorax, COPD, pneumonia, infectious cause based on clinical history and exam at this time    PROCEDURES:  Critical Care performed: No  Procedures   MEDICATIONS ORDERED IN ED: Medications  clonazePAM (KLONOPIN) tablet 1 mg (1 mg Oral Given 01/22/23 1504)     IMPRESSION / MDM / ASSESSMENT AND PLAN / ED COURSE  I  reviewed the triage vital signs and the nursing notes.                              Differential diagnosis includes, but is not limited to, anxiety, panic attack, other causes such as metabolic abnormality cardiac etiology are considered but deemed highly unlikely based on clinical history as well as his descriptor of previous symptoms of same nature.  He advises he has been treated with clonazepam in the past which has been most helpful, he has not had a prescription in some time.    Patient's presentation is most consistent with acute complicated illness / injury requiring diagnostic workup.   ----------------------------------------- 3:29 PM on 01/22/2023 ----------------------------------------- Ongoing care assigned to Dr. Derrill Kay.  ECG is pending.  Also reassessment after medication with clonazepam.  If patient's symptoms well-controlled, suspect in keeping with his clinical history that this is likely recurrent history of panic attack.  He is awake alert well-oriented nontoxic.  Agreeable not to drive home or while using clonazepam.  I provided a brief as needed prescription for him as well and he has upcoming psychiatry appointment.       FINAL CLINICAL IMPRESSION(S) / ED DIAGNOSES   Final diagnoses:  Anxiety about health     Rx / DC Orders   ED Discharge Orders          Ordered    clonazePAM (KLONOPIN) 0.5 MG tablet  2 times daily PRN        01/22/23 1528             Note:  This document was prepared using Dragon voice recognition software and may include unintentional dictation errors.   Sharyn Creamer, MD 01/22/23 1530

## 2023-02-09 ENCOUNTER — Emergency Department
Admission: EM | Admit: 2023-02-09 | Discharge: 2023-02-09 | Disposition: A | Discharge status: Home / Self Care | Payer: Medicaid Other | Attending: Emergency Medicine | Admitting: Emergency Medicine

## 2023-02-09 ENCOUNTER — Other Ambulatory Visit: Payer: Self-pay

## 2023-02-09 DIAGNOSIS — R251 Tremor, unspecified: Secondary | ICD-10-CM | POA: Insufficient documentation

## 2023-02-09 DIAGNOSIS — F410 Panic disorder [episodic paroxysmal anxiety] without agoraphobia: Secondary | ICD-10-CM

## 2023-02-09 DIAGNOSIS — F419 Anxiety disorder, unspecified: Secondary | ICD-10-CM | POA: Diagnosis present

## 2023-02-09 MED ORDER — ONDANSETRON HCL 4 MG/2ML IJ SOLN
4.0000 mg | Freq: Once | INTRAMUSCULAR | Status: AC
  Administered 2023-02-09: 4 mg via INTRAVENOUS
  Filled 2023-02-09: qty 2

## 2023-02-09 MED ORDER — HALOPERIDOL LACTATE 5 MG/ML IJ SOLN
5.0000 mg | Freq: Once | INTRAMUSCULAR | Status: AC
  Administered 2023-02-09: 5 mg via INTRAVENOUS
  Filled 2023-02-09: qty 1

## 2023-02-09 MED ORDER — CLONAZEPAM 0.5 MG PO TABS
0.5000 mg | ORAL_TABLET | Freq: Once | ORAL | Status: DC
  Filled 2023-02-09: qty 1

## 2023-02-09 MED ORDER — LORAZEPAM 2 MG/ML IJ SOLN
1.0000 mg | Freq: Once | INTRAMUSCULAR | Status: AC
  Administered 2023-02-09: 1 mg via INTRAVENOUS
  Filled 2023-02-09: qty 1

## 2023-02-09 NOTE — ED Notes (Signed)
Pt came to the nurses station and stated that his grandma was waiting out front and he could not make her wait any longer. Pt was given his discharge paperwork. Pt ambulatory out of the ED without assistance or difficulty. Pt Verbalized understanding of discharge instructions.

## 2023-02-09 NOTE — ED Notes (Signed)
Pt calling out at this time. Denies that ativan is working. MD made aware.

## 2023-02-09 NOTE — ED Notes (Addendum)
Pt calling out at this time. Pt states he is having "auditory hallucinations". Pt states he is "not hearing any voices" but states he can hear "Pulsing in his body". Pt states he is upset he was given haldol and "does not like the way it makes me feel" pt has pulled off all monitoring equipment.

## 2023-02-09 NOTE — ED Triage Notes (Addendum)
Pt to Ed via [facility] from home. Pt reports slept good last night and woke up this morning believed he was having seizures and fell off his bed onto the floor and states he was convulsing. Pt denies head trauma. No hx of seizures. 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 Seen on 11/13 for anxiety. Pt with visible tremors in legs. Pt ambualtroy on seen per EMS. Pt speaking without difficulty. Speech clear. Pt A&Ox4.   Full rainbow of blood sent  EMS VS: 160/98 118 HR  CBG 154

## 2023-02-09 NOTE — ED Provider Notes (Signed)
[facility] Provider Note    Event Date/Time   First MD Initiated Contact with Patient 02/09/23 0840     (approximate)   History   Tremors and Anxiety   HPI  Zachary Ballard is a 27 y.o. male who presents to the emergency department today because of concerns for shaking episodes that have occurred at night.  The patient says that this has been going on for about the past week.  He did try taking some clonazepam which did seem to help with the symptoms.  The patient additionally states that he has history of panic attacks and feels like he is currently having a panic attack at the time my exam.     Physical Exam   Triage Vital Signs: ED Triage Vitals  Encounter Vitals Group     BP 02/09/23 0839 (!) 137/94     Systolic BP Percentile --      Diastolic BP Percentile --      Pulse Rate 02/09/23 0839 90     Resp 02/09/23 0839 20     Temp 02/09/23 0839 98 F (36.7 C)     Temp Source 02/09/23 0839 Oral     SpO2 02/09/23 0839 99 %     Weight --      Height --      Head Circumference --      Peak Flow --      Pain Score 02/09/23 0840 2     Pain Loc --      Pain Education --      Exclude from Growth Chart --     Most recent vital signs: Vitals:   02/09/23 0839  BP: (!) 137/94  Pulse: 90  Resp: 20  Temp: 98 F (36.7 C)  SpO2: 99%    General: Awake, alert, anxious CV:  Good peripheral perfusion. tachycardia Resp:  Normal effort. Lungs clear Abd:  No distention.    ED Results / Procedures / Treatments   Labs (all labs ordered are listed, but only abnormal results are displayed) Labs Reviewed - No data to display   EKG  None   RADIOLOGY None  PROCEDURES:  Critical Care performed: No    MEDICATIONS ORDERED IN ED: Medications - No data to display   IMPRESSION / MDM / ASSESSMENT AND PLAN / ED COURSE  I reviewed the triage vital signs and the nursing notes.                              Differential diagnosis  includes, but is not limited to, anxiety, electrolyte abnormality, seizures  Patient's presentation is most consistent with acute presentation with potential threat to life or bodily function.   The patient is on the cardiac monitor to evaluate for evidence of arrhythmia and/or significant heart rate changes.  Patient presented to the emergency department today with concern for possible seizures. On exam patient is awake and alert albeit anxious. Patient was given medications here in the emergency department and was able to sleep. At this time do think patient's symptoms secondary to anxiety and panic attack. Given improvement in symptoms think it is reasonable for patient to be discharged at this time.    FINAL CLINICAL IMPRESSION(S) / ED DIAGNOSES   Final diagnoses:  Anxiety attack     Note:  This document was prepared using Dragon voice recognition software and may include unintentional dictation errors.    Phineas Semen,  MD 02/09/23 1142

## 2023-02-09 NOTE — ED Notes (Signed)
Pt resting. Chest rise and fall noted.

## 2023-02-15 ENCOUNTER — Emergency Department: Payer: Medicaid Other

## 2023-02-15 ENCOUNTER — Emergency Department
Admission: EM | Admit: 2023-02-15 | Discharge: 2023-02-15 | Disposition: A | Discharge status: Home / Self Care | Payer: Medicaid Other | Attending: Emergency Medicine | Admitting: Emergency Medicine

## 2023-02-15 ENCOUNTER — Other Ambulatory Visit: Payer: Self-pay

## 2023-02-15 DIAGNOSIS — F419 Anxiety disorder, unspecified: Secondary | ICD-10-CM | POA: Insufficient documentation

## 2023-02-15 LAB — CBC WITH DIFFERENTIAL/PLATELET
Abs Immature Granulocytes: 0.04 10*3/uL (ref 0.00–0.07)
Basophils Absolute: 0 10*3/uL (ref 0.0–0.1)
Basophils Relative: 0 %
Eosinophils Absolute: 0 10*3/uL (ref 0.0–0.5)
Eosinophils Relative: 0 %
HCT: 44.2 % (ref 39.0–52.0)
Hemoglobin: 16 g/dL (ref 13.0–17.0)
Immature Granulocytes: 0 %
Lymphocytes Relative: 12 %
Lymphs Abs: 1.2 10*3/uL (ref 0.7–4.0)
MCH: 30 pg (ref 26.0–34.0)
MCHC: 36.2 g/dL — ABNORMAL HIGH (ref 30.0–36.0)
MCV: 82.9 fL (ref 80.0–100.0)
Monocytes Absolute: 0.6 10*3/uL (ref 0.1–1.0)
Monocytes Relative: 6 %
Neutro Abs: 7.8 10*3/uL — ABNORMAL HIGH (ref 1.7–7.7)
Neutrophils Relative %: 82 %
Platelets: 436 10*3/uL — ABNORMAL HIGH (ref 150–400)
RBC: 5.33 MIL/uL (ref 4.22–5.81)
RDW: 11.2 % — ABNORMAL LOW (ref 11.5–15.5)
WBC: 9.6 10*3/uL (ref 4.0–10.5)
nRBC: 0 % (ref 0.0–0.2)

## 2023-02-15 LAB — BASIC METABOLIC PANEL
Anion gap: 13 (ref 5–15)
BUN: 18 mg/dL (ref 6–20)
CO2: 23 mmol/L (ref 22–32)
Calcium: 9.2 mg/dL (ref 8.9–10.3)
Chloride: 97 mmol/L — ABNORMAL LOW (ref 98–111)
Creatinine, Ser: 1.08 mg/dL (ref 0.61–1.24)
GFR, Estimated: >60 mL/min (ref >60)
Glucose, Bld: 124 mg/dL — ABNORMAL HIGH (ref 70–99)
Potassium: 3.3 mmol/L — ABNORMAL LOW (ref 3.5–5.1)
Sodium: 133 mmol/L — ABNORMAL LOW (ref 135–145)

## 2023-02-15 LAB — TROPONIN I (HIGH SENSITIVITY)
Troponin I (High Sensitivity): 4 ng/L (ref <18)
Troponin I (High Sensitivity): 4 ng/L (ref <18)

## 2023-02-15 MED ORDER — CLONAZEPAM 0.25 MG PO TBDP: 0.2500 mg | ORAL_TABLET | Freq: Once | ORAL | Status: DC

## 2023-02-15 MED ORDER — ONDANSETRON HCL 4 MG/2ML IJ SOLN
4.0000 mg | Freq: Once | INTRAMUSCULAR | Status: AC
  Administered 2023-02-15: 4 mg via INTRAVENOUS
  Filled 2023-02-15: qty 2

## 2023-02-15 MED ORDER — LORAZEPAM 2 MG/ML IJ SOLN
1.0000 mg | Freq: Once | INTRAMUSCULAR | Status: AC
  Administered 2023-02-15: 1 mg via INTRAVENOUS
  Filled 2023-02-15: qty 1

## 2023-02-15 MED ORDER — SODIUM CHLORIDE 0.9 % IV BOLUS
1000.0000 mL | Freq: Once | INTRAVENOUS | Status: AC
  Administered 2023-02-15: 1000 mL via INTRAVENOUS

## 2023-02-15 NOTE — Discharge Instructions (Signed)
Please follow-up with your psychiatric provider as you have scheduled.  Please return for any new, worsening, or change in symptoms or other concerns.  It was a pleasure caring for you today.

## 2023-02-15 NOTE — ED Triage Notes (Signed)
Pt reports has panic attacks and has bene having them intermittently here lately onn the weekends. Pt reports has an appt on 12/21 with his MD. Pt reports he usually comes here and is given clonazepam 2mg  and it helps for a bit and then the panic attacks come back.

## 2023-02-15 NOTE — ED Triage Notes (Signed)
Pt in via EMS from home with c/o panic attack. EMS reports pt has bene having them every weekend for the last 3 weekends, was seen here last weekend for the same and was given meds to stop it. Has appt with new MD next month. Was on meds for panic attacks in the past.

## 2023-02-15 NOTE — ED Provider Notes (Signed)
[facility] Provider Note    Event Date/Time   First MD Initiated Contact with Patient 02/15/23 [phone]     (approximate)   History   Panic Attack   HPI  Zachary Ballard is a 27 y.o. male with a past medical history of anxiety disorder with panic attacks who presents today for evaluation of panic attack.  Patient reports that this happens to him every weekend because he is time to ruminate and then he thinks about his symptoms and this causes him to have an anxiety attack.  He reports that he feels tremulous and feels like he is hyperventilating.  He reports that the only thing that works for him is Ativan and clonazepam.  He denies any drug use.  He reports that he used to have a history of drug use but never injected drugs, reports that it was always pills that he took, but reports that he has not taken any pills in several months.  No infectious type symptoms.  No chest pain.  He reports that this feels exactly the same as his panic attacks.  He denies SI/HI/AVH.  Patient Active Problem List   Diagnosis Date Noted   Overdose 11/10/2022   Aspiration pneumonia (HCC) 11/10/2022   Generalized anxiety disorder with panic attacks 07/17/2022   Accidental drug overdose 09/27/2020          Physical Exam   Triage Vital Signs: ED Triage Vitals  Encounter Vitals Group     BP 02/15/23 0813 (!) 164/90     Systolic BP Percentile --      Diastolic BP Percentile --      Pulse Rate 02/15/23 0813 (!) 120     Resp --      Temp 02/15/23 0813 99.8 F (37.7 C)     Temp Source 02/15/23 0813 Oral     SpO2 02/15/23 0813 97 %     Weight 02/15/23 0812 194 lb 0.1 oz (88 kg)     Height 02/15/23 0812 5\' 9"  (1.753 m)     Head Circumference --      Peak Flow --      Pain Score 02/15/23 0812 0     Pain Loc --      Pain Education --      Exclude from Growth Chart --     Most recent vital signs: Vitals:   02/15/23 0813 02/15/23 1031  BP: (!) 164/90 (!) 140/97   Pulse: (!) 120 87  Resp:  20  Temp: 99.8 F (37.7 C) 98.7 F (37.1 C)  SpO2: 97% 100%    Physical Exam Vitals and nursing note reviewed.  Constitutional:      General: Awake and alert.  Anxious appearing, jiggling his legs    Appearance: Normal appearance. The patient is normal weight.  HENT:     Head: Normocephalic and atraumatic.     Mouth: Mucous membranes are moist.  Eyes:     General: PERRL. Normal EOMs        Right eye: No discharge.        Left eye: No discharge.     Conjunctiva/sclera: Conjunctivae normal.  Cardiovascular:     Rate and Rhythm: Tachycardic rate and regular rhythm.     Pulses: Normal pulses.  Pulmonary:     Effort: Pulmonary effort is normal. No respiratory distress.     Breath sounds: Normal breath sounds.  Abdominal:     Abdomen is soft. There is no abdominal tenderness. No  rebound or guarding. No distention. Musculoskeletal:        General: No swelling. Normal range of motion.     Cervical back: Normal range of motion and neck supple.  Skin:    General: Skin is warm and dry.     Capillary Refill: Capillary refill takes less than 2 seconds.     Findings: No rash.  Neurological:     Mental Status: The patient is awake and alert.  Psych: Anxious appearing, though calm, cooperative, linear, goal oriented, normal eye contact, normal affect, does not appear to be responding to internal stimuli.  Denies SI/HI   ED Results / Procedures / Treatments   Labs (all labs ordered are listed, but only abnormal results are displayed) Labs Reviewed  CBC WITH DIFFERENTIAL/PLATELET - Abnormal; Notable for the following components:      Result Value   MCHC 36.2 (*)    RDW 11.2 (*)    Platelets 436 (*)    Neutro Abs 7.8 (*)    All other components within normal limits  BASIC METABOLIC PANEL - Abnormal; Notable for the following components:   Sodium 133 (*)    Potassium 3.3 (*)    Chloride 97 (*)    Glucose, Bld 124 (*)    All other components within  normal limits  TROPONIN I (HIGH SENSITIVITY)  TROPONIN I (HIGH SENSITIVITY)     EKG     RADIOLOGY I independently reviewed and interpreted imaging and agree with radiologists findings.     PROCEDURES:  Critical Care performed:   Procedures   MEDICATIONS ORDERED IN ED: Medications  LORazepam (ATIVAN) injection 1 mg (1 mg Intravenous Given 02/15/23 0916)  ondansetron (ZOFRAN) injection 4 mg (4 mg Intravenous Given 02/15/23 0916)  LORazepam (ATIVAN) injection 1 mg (1 mg Intravenous Given 02/15/23 0952)  sodium chloride 0.9 % bolus 1,000 mL (0 mLs Intravenous Stopped 02/15/23 1126)     01/22/2023 01/22/2023  5 Clonazepam 0.5 Mg Tablet 6.00 3 Gr Goo [id number] [facility] ([id number]) 0/0 2.00 LME Medicaid [place]  07/17/2022 07/17/2022  5 Alprazolam 1 Mg Tablet 14.00 7 Sa Par [id number] [facility] ([id number]) 0/0 4.00 LME Medicaid [place]  07/12/2022 07/12/2022  1 Lorazepam 1 Mg Tablet 8.00 3 Si Won [id number] [facility] ([id number]) 0/0 2.67 LME Medicaid [place]  05/27/2022 05/27/2022  5 Lorazepam 1 Mg Tablet 12.00 4 Ro Kin [id number] [facility] ([id number]) 0/0 3.00 LME Medicaid [place]    IMPRESSION / MDM / ASSESSMENT AND PLAN / ED COURSE  I reviewed the triage vital signs and the nursing notes.   Differential diagnosis includes, but is not limited to, anxiety attack, panic attack, tachycardia, electrolyte disarray, acute coronary syndrome, arrhythmia, substance use.  Patient is awake and alert, tachycardic on arrival to 120 though obviously anxious jiggling his legs.  He is quite certain that his symptoms today are exactly the same as his previous anxiety attacks.  He was placed on the cardiac monitor and continuous pulse oximetry.  I reviewed the patient's chart.  Patient was seen in the emergency department 6 days ago with the same complaints, as well as in November for the same complaint.  Patient agreed to medical workup to evaluate for medical etiology of his symptoms today.  Workup is reassuring, negative troponin x 2.  He was treated  symptomatically with Ativan/IVF with significant improvement of his symptoms.  Tachycardia resolved.  Discussed with patient that I will not be prescribing benzodiazepines as he has had numerous previous prescribers, though offered alternatives Atarax which she  declined.  He has a follow-up appointment already scheduled with his psychiatric provider.  I recommended that he keep this appointment.  We discussed return precautions in the meantime and the importance of close outpatient follow-up.  Patient understands and agrees with plan.  He was discharged in stable condition.   Patient's presentation is most consistent with exacerbation of chronic illness.   Clinical Course as of 02/15/23 1431  Sat Feb 15, 2023  1046 When viewed through the window of his room, patient is resting comfortably, not shaking.  When I open the door to speak with him then he starts what appears to be voluntary jiggling of his legs [JP]    Clinical Course User Index [JP] Talon Regala, Herb Grays, PA-C     FINAL CLINICAL IMPRESSION(S) / ED DIAGNOSES   Final diagnoses:  Anxiety     Rx / DC Orders   ED Discharge Orders     None        Note:  This document was prepared using Dragon voice recognition software and may include unintentional dictation errors.   Jackelyn Hoehn, PA-C 02/15/23 1431    Janith Lima, MD 02/15/23 1438

## 2023-02-15 NOTE — ED Notes (Signed)
Still feels panicy. And nasueated.
